# Patient Record
Sex: Female | Born: 1945 | Race: White | Hispanic: No | Smoking: Former smoker
Health system: Southern US, Community
[De-identification: ages and names within clinical notes are randomized; demographics above are authoritative.]

## PROBLEM LIST (undated history)

## (undated) DIAGNOSIS — R51 Headache: Secondary | ICD-10-CM

## (undated) DIAGNOSIS — R519 Headache, unspecified: Secondary | ICD-10-CM

## (undated) HISTORY — PX: DILATION AND CURETTAGE, DIAGNOSTIC / THERAPEUTIC: SUR384

---

## 2018-04-25 ENCOUNTER — Emergency Department (HOSPITAL_COMMUNITY)
Admission: EM | Admit: 2018-04-25 | Discharge: 2018-04-25 | Disposition: A | Discharge status: Home / Self Care | Payer: Medicare Other | Attending: Emergency Medicine | Admitting: Emergency Medicine

## 2018-04-25 ENCOUNTER — Other Ambulatory Visit: Payer: Self-pay

## 2018-04-25 ENCOUNTER — Emergency Department (HOSPITAL_COMMUNITY): Payer: Medicare Other

## 2018-04-25 ENCOUNTER — Encounter (HOSPITAL_COMMUNITY): Payer: Self-pay

## 2018-04-25 DIAGNOSIS — S52531A Colles' fracture of right radius, initial encounter for closed fracture: Secondary | ICD-10-CM | POA: Insufficient documentation

## 2018-04-25 DIAGNOSIS — W19XXXA Unspecified fall, initial encounter: Secondary | ICD-10-CM

## 2018-04-25 DIAGNOSIS — Y999 Unspecified external cause status: Secondary | ICD-10-CM | POA: Insufficient documentation

## 2018-04-25 DIAGNOSIS — Y92008 Other place in unspecified non-institutional (private) residence as the place of occurrence of the external cause: Secondary | ICD-10-CM | POA: Diagnosis not present

## 2018-04-25 DIAGNOSIS — S81811A Laceration without foreign body, right lower leg, initial encounter: Secondary | ICD-10-CM | POA: Diagnosis not present

## 2018-04-25 DIAGNOSIS — W010XXA Fall on same level from slipping, tripping and stumbling without subsequent striking against object, initial encounter: Secondary | ICD-10-CM | POA: Diagnosis not present

## 2018-04-25 DIAGNOSIS — S59911A Unspecified injury of right forearm, initial encounter: Secondary | ICD-10-CM | POA: Diagnosis present

## 2018-04-25 DIAGNOSIS — Y939 Activity, unspecified: Secondary | ICD-10-CM | POA: Diagnosis not present

## 2018-04-25 DIAGNOSIS — Y92009 Unspecified place in unspecified non-institutional (private) residence as the place of occurrence of the external cause: Secondary | ICD-10-CM

## 2018-04-25 LAB — CBG MONITORING, ED: Glucose-Capillary: 98 mg/dL (ref 70–99)

## 2018-04-25 MED ORDER — HYDROCODONE-ACETAMINOPHEN 5-325 MG PO TABS: 0.5000 | ORAL_TABLET | ORAL | 0 refills | Status: DC | PRN

## 2018-04-25 MED ORDER — HYDROCODONE-ACETAMINOPHEN 5-325 MG PO TABS
1.0000 | ORAL_TABLET | Freq: Once | ORAL | Status: AC
  Administered 2018-04-25: 1 via ORAL
  Filled 2018-04-25: qty 1

## 2018-04-25 MED ORDER — TETANUS-DIPHTH-ACELL PERTUSSIS 5-2.5-18.5 LF-MCG/0.5 IM SUSP
0.5000 mL | Freq: Once | INTRAMUSCULAR | Status: AC
  Administered 2018-04-25: 0.5 mL via INTRAMUSCULAR
  Filled 2018-04-25: qty 0.5

## 2018-04-25 NOTE — ED Provider Notes (Addendum)
[facility]-EMERGENCY DEPT Provider Note: Lowella Dell, MD, FACEP  CSN: [id number] MRN: [id number] ARRIVAL: 04/25/18 at 0138 ROOM: WA20/WA20   CHIEF COMPLAINT  Fall   HISTORY OF PRESENT ILLNESS  04/25/18 2:59 AM Susan Fletcher is a 72 y.o. female who fell outside on her brick steps just prior to arrival.  She has right wrist pain with associated deformity.  She is having moderate pain at that site, worse with palpation or movement.  It was splinted prior to arrival.  She also has skin tears to her right shin.  She denies neck pain or back pain.  She is not sure of her tetanus status   History reviewed. No pertinent past medical history.  History reviewed. No pertinent surgical history.  No family history on file.  Social History   Tobacco Use  . Smoking status: Not on file  Substance Use Topics  . Alcohol use: Not on file  . Drug use: Not on file    Prior to Admission medications   Medication Sig Start Date End Date Taking? Authorizing Provider  aspirin-acetaminophen-caffeine (EXCEDRIN MIGRAINE) [id number] MG tablet Take 1 tablet by mouth every 6 (six) hours as needed for headache.   Yes [provider]    Allergies Patient has no known allergies.   REVIEW OF SYSTEMS  Negative except as noted here or in the History of Present Illness.   PHYSICAL EXAMINATION  Initial Vital Signs Blood pressure (!) 128/95, pulse 66, temperature (!) 97.4 F (36.3 C), temperature source Oral, resp. rate 17, height 5\' 1"  (1.549 m), weight 44.5 kg, SpO2 100 %.  Examination General: Well-developed, well-nourished female in no acute distress; appearance consistent with age of record HENT: normocephalic; atraumatic Eyes: pupils equal, round and reactive to light; extraocular muscles intact Neck: supple; nontender Heart: regular rate and rhythm Lungs: clear to auscultation bilaterally Abdomen: soft; nondistended; nontender; bowel sounds present Extremities: Deformity and tenderness  of right wrist, right hand distally neurovascularly intact with intact tendon function in the fingers; pulses normal Neurologic: Awake, alert and oriented; motor function intact in all extremities and symmetric; no facial droop Skin: Warm and dry; 2 skin tears right shin Psychiatric: Normal mood and affect   RESULTS  Summary of this visit's results, reviewed by myself:   EKG Interpretation  Date/Time:    Ventricular Rate:    PR Interval:    QRS Duration:   QT Interval:    QTC Calculation:   R Axis:     Text Interpretation:        Laboratory Studies: Results for orders placed or performed during the hospital encounter of 04/25/18 (from the past 24 hour(s))  POC CBG, ED     Status: None   Collection Time: 04/25/18  1:54 AM  Result Value Ref Range   Glucose-Capillary 98 70 - 99 mg/dL   Imaging Studies: Dg Wrist Complete Right  Result Date: 04/25/2018 CLINICAL DATA:  Fall with wrist pain EXAM: RIGHT WRIST - COMPLETE 3+ VIEW COMPARISON:  None. FINDINGS: There is a comminuted dorsally displaced and angulated fracture of the distal right radius with approximately 18 mm of overriding. No ulna fracture. IMPRESSION: Comminuted, dorsally displaced and angulated fracture of the distal right radius. Electronically Signed   By: Deatra Robinson M.D.   On: 04/25/2018 03:38    ED COURSE and MDM  Nursing notes and initial vitals signs, including pulse oximetry, reviewed.  Vitals:   04/25/18 0149 04/25/18 0151  BP:  (!) 128/95  Pulse:  66  Resp:  17  Temp:  (!) 97.4 F (36.3 C)  TempSrc:  Oral  SpO2:  100%  Weight: 44.5 kg   Height: 5\' 1"  (1.549 m)    3:55 AM Discussed with Dr. Ophelia Charter of orthopedic surgery.  He advises we placed the patient in a sling and he will see her in the office later this morning.   Consultation with the [facility] state controlled substances database reveals the patient has received no opioid prescriptions in the past 2 years.   PROCEDURES    ED  DIAGNOSES     ICD-10-CM   1. Closed Colles' fracture of right radius, initial encounter S52.531A   2. Noninfected skin tear of right leg, initial encounter S81.811A   3. Fall in home, initial encounter W19.XXXA    Y92.009        Paula Libra, MD 04/25/18 0356    Paula Libra, MD 04/25/18 [phone]

## 2018-04-25 NOTE — ED Triage Notes (Signed)
Patient arrives by [facility] with complaints of falling outside on her brick steps. Patient lives by herself. Patient has right wrist injury with deformity. Patient has splint to right wrist area.

## 2018-04-25 NOTE — ED Notes (Signed)
Patient transported to X-ray 

## 2018-04-25 NOTE — ED Notes (Signed)
Ortho tech at bedside 

## 2018-04-25 NOTE — ED Notes (Signed)
Called Ortho Tech ( Mr Annette Stable ) @[phone] 

## 2018-04-26 ENCOUNTER — Ambulatory Visit (INDEPENDENT_AMBULATORY_CARE_PROVIDER_SITE_OTHER): Payer: Medicare Other | Admitting: Surgery

## 2018-04-26 ENCOUNTER — Encounter (INDEPENDENT_AMBULATORY_CARE_PROVIDER_SITE_OTHER): Payer: Self-pay | Admitting: Surgery

## 2018-04-26 ENCOUNTER — Ambulatory Visit (INDEPENDENT_AMBULATORY_CARE_PROVIDER_SITE_OTHER): Payer: Self-pay

## 2018-04-26 VITALS — BP 134/80 | HR 80 | Ht 61.0 in | Wt 98.0 lb

## 2018-04-26 DIAGNOSIS — M25531 Pain in right wrist: Secondary | ICD-10-CM

## 2018-04-27 ENCOUNTER — Encounter (HOSPITAL_BASED_OUTPATIENT_CLINIC_OR_DEPARTMENT_OTHER): Payer: Self-pay | Admitting: *Deleted

## 2018-04-27 ENCOUNTER — Other Ambulatory Visit: Payer: Self-pay

## 2018-04-27 NOTE — Progress Notes (Signed)
Patient states she may not have a ride to the hospital, and may not be able to have someone be with her for 24 hours.  She is going to try to find someone this weekend.

## 2018-04-30 ENCOUNTER — Encounter (HOSPITAL_BASED_OUTPATIENT_CLINIC_OR_DEPARTMENT_OTHER): Payer: Self-pay | Admitting: Anesthesiology

## 2018-04-30 ENCOUNTER — Other Ambulatory Visit: Payer: Self-pay

## 2018-04-30 ENCOUNTER — Ambulatory Visit (HOSPITAL_BASED_OUTPATIENT_CLINIC_OR_DEPARTMENT_OTHER): Payer: Medicare Other | Admitting: Anesthesiology

## 2018-04-30 ENCOUNTER — Encounter (HOSPITAL_BASED_OUTPATIENT_CLINIC_OR_DEPARTMENT_OTHER)
Admission: RE | Disposition: A | Discharge status: Home / Self Care | Payer: Self-pay | Source: Ambulatory Visit | Attending: Orthopaedic Surgery

## 2018-04-30 ENCOUNTER — Ambulatory Visit (HOSPITAL_BASED_OUTPATIENT_CLINIC_OR_DEPARTMENT_OTHER)
Admission: RE | Admit: 2018-04-30 | Discharge: 2018-05-01 | Disposition: A | Discharge status: Home / Self Care | Payer: Medicare Other | Source: Ambulatory Visit | Attending: Orthopaedic Surgery | Admitting: Orthopaedic Surgery

## 2018-04-30 DIAGNOSIS — S52531A Colles' fracture of right radius, initial encounter for closed fracture: Secondary | ICD-10-CM | POA: Diagnosis not present

## 2018-04-30 DIAGNOSIS — S52501A Unspecified fracture of the lower end of right radius, initial encounter for closed fracture: Secondary | ICD-10-CM | POA: Diagnosis present

## 2018-04-30 DIAGNOSIS — W109XXA Fall (on) (from) unspecified stairs and steps, initial encounter: Secondary | ICD-10-CM | POA: Diagnosis not present

## 2018-04-30 DIAGNOSIS — S52551A Other extraarticular fracture of lower end of right radius, initial encounter for closed fracture: Secondary | ICD-10-CM | POA: Diagnosis not present

## 2018-04-30 DIAGNOSIS — Z87891 Personal history of nicotine dependence: Secondary | ICD-10-CM | POA: Diagnosis not present

## 2018-04-30 HISTORY — PX: ORIF WRIST FRACTURE: SHX2133

## 2018-04-30 HISTORY — DX: Headache: R51

## 2018-04-30 HISTORY — DX: Headache, unspecified: R51.9

## 2018-04-30 SURGERY — OPEN REDUCTION INTERNAL FIXATION (ORIF) WRIST FRACTURE
Anesthesia: Regional | Site: Wrist | Laterality: Right

## 2018-04-30 MED ORDER — MIDAZOLAM HCL 2 MG/2ML IJ SOLN
INTRAMUSCULAR | Status: AC
  Filled 2018-04-30: qty 2

## 2018-04-30 MED ORDER — BUPIVACAINE HCL (PF) 0.5 % IJ SOLN
INTRAMUSCULAR | Status: AC
  Filled 2018-04-30: qty 30

## 2018-04-30 MED ORDER — ONDANSETRON HCL 4 MG/2ML IJ SOLN
INTRAMUSCULAR | Status: DC | PRN
  Administered 2018-04-30: 4 mg via INTRAVENOUS

## 2018-04-30 MED ORDER — HYDROCODONE-ACETAMINOPHEN 5-325 MG PO TABS
1.0000 | ORAL_TABLET | ORAL | Status: DC | PRN
  Filled 2018-04-30: qty 1

## 2018-04-30 MED ORDER — LACTATED RINGERS IV SOLN
INTRAVENOUS | Status: DC
  Administered 2018-04-30: 10 mL/h via INTRAVENOUS

## 2018-04-30 MED ORDER — ONDANSETRON HCL 4 MG/2ML IJ SOLN
INTRAMUSCULAR | Status: AC
  Filled 2018-04-30: qty 2

## 2018-04-30 MED ORDER — LIDOCAINE 2% (20 MG/ML) 5 ML SYRINGE
INTRAMUSCULAR | Status: AC
  Filled 2018-04-30: qty 5

## 2018-04-30 MED ORDER — CEFAZOLIN SODIUM-DEXTROSE 2-4 GM/100ML-% IV SOLN
INTRAVENOUS | Status: AC
  Filled 2018-04-30: qty 100

## 2018-04-30 MED ORDER — ONDANSETRON HCL 4 MG PO TABS: 4.0000 mg | ORAL_TABLET | Freq: Four times a day (QID) | ORAL | Status: DC | PRN

## 2018-04-30 MED ORDER — FENTANYL CITRATE (PF) 100 MCG/2ML IJ SOLN
INTRAMUSCULAR | Status: AC
  Filled 2018-04-30: qty 2

## 2018-04-30 MED ORDER — MIDAZOLAM HCL 2 MG/2ML IJ SOLN
1.0000 mg | INTRAMUSCULAR | Status: DC | PRN
  Administered 2018-04-30: 1 mg via INTRAVENOUS

## 2018-04-30 MED ORDER — LIDOCAINE 2% (20 MG/ML) 5 ML SYRINGE
INTRAMUSCULAR | Status: DC | PRN
  Administered 2018-04-30: 50 mg via INTRAVENOUS

## 2018-04-30 MED ORDER — SCOPOLAMINE 1 MG/3DAYS TD PT72: 1.0000 | MEDICATED_PATCH | Freq: Once | TRANSDERMAL | Status: DC | PRN

## 2018-04-30 MED ORDER — PROPOFOL 10 MG/ML IV BOLUS
INTRAVENOUS | Status: AC
  Filled 2018-04-30: qty 20

## 2018-04-30 MED ORDER — ROPIVACAINE HCL 7.5 MG/ML IJ SOLN
INTRAMUSCULAR | Status: DC | PRN
  Administered 2018-04-30: 20 mL via PERINEURAL

## 2018-04-30 MED ORDER — FENTANYL CITRATE (PF) 100 MCG/2ML IJ SOLN
50.0000 ug | INTRAMUSCULAR | Status: DC | PRN
  Administered 2018-04-30 (×2): 50 ug via INTRAVENOUS

## 2018-04-30 MED ORDER — DEXAMETHASONE SODIUM PHOSPHATE 10 MG/ML IJ SOLN
INTRAMUSCULAR | Status: AC
  Filled 2018-04-30: qty 1

## 2018-04-30 MED ORDER — SODIUM CHLORIDE 0.45 % IV SOLN
INTRAVENOUS | Status: DC
  Administered 2018-04-30: 16:00:00 via INTRAVENOUS

## 2018-04-30 MED ORDER — ASPIRIN-ACETAMINOPHEN-CAFFEINE 250-250-65 MG PO TABS: 1.0000 | ORAL_TABLET | Freq: Four times a day (QID) | ORAL | Status: DC | PRN

## 2018-04-30 MED ORDER — EPHEDRINE SULFATE 50 MG/ML IJ SOLN
INTRAMUSCULAR | Status: DC | PRN
  Administered 2018-04-30 (×3): 10 mg via INTRAVENOUS

## 2018-04-30 MED ORDER — MORPHINE SULFATE (PF) 4 MG/ML IV SOLN: 0.5000 mg | INTRAVENOUS | Status: DC | PRN

## 2018-04-30 MED ORDER — ACETAMINOPHEN 500 MG PO TABS
500.0000 mg | ORAL_TABLET | Freq: Four times a day (QID) | ORAL | Status: DC
  Administered 2018-04-30: 500 mg via ORAL
  Filled 2018-04-30: qty 1

## 2018-04-30 MED ORDER — EPHEDRINE 5 MG/ML INJ
INTRAVENOUS | Status: AC
  Filled 2018-04-30: qty 10

## 2018-04-30 MED ORDER — CEFAZOLIN SODIUM-DEXTROSE 2-4 GM/100ML-% IV SOLN
2.0000 g | INTRAVENOUS | Status: AC
  Administered 2018-04-30: 2 g via INTRAVENOUS

## 2018-04-30 MED ORDER — PROPOFOL 10 MG/ML IV BOLUS
INTRAVENOUS | Status: DC | PRN
  Administered 2018-04-30: 100 mg via INTRAVENOUS

## 2018-04-30 MED ORDER — ONDANSETRON HCL 4 MG/2ML IJ SOLN: 4.0000 mg | Freq: Four times a day (QID) | INTRAMUSCULAR | Status: DC | PRN

## 2018-04-30 MED ORDER — CHLORHEXIDINE GLUCONATE 4 % EX LIQD: 60.0000 mL | Freq: Once | CUTANEOUS | Status: DC

## 2018-04-30 MED ORDER — DEXAMETHASONE SODIUM PHOSPHATE 4 MG/ML IJ SOLN
INTRAMUSCULAR | Status: DC | PRN
  Administered 2018-04-30: 10 mg via INTRAVENOUS

## 2018-04-30 MED ORDER — ACETAMINOPHEN 325 MG PO TABS: 325.0000 mg | ORAL_TABLET | Freq: Four times a day (QID) | ORAL | Status: DC | PRN

## 2018-04-30 MED ORDER — HYDROCODONE-ACETAMINOPHEN 5-325 MG PO TABS
1.0000 | ORAL_TABLET | ORAL | Status: DC | PRN
  Administered 2018-05-01: 1 via ORAL

## 2018-04-30 SURGICAL SUPPLY — 56 items
BANDAGE ACE 4X5 VEL STRL LF (GAUZE/BANDAGES/DRESSINGS) ×3 IMPLANT
BIT DRILL 2 FAST STEP (BIT) ×3 IMPLANT
BIT DRILL 2.5X4 QC (BIT) ×3 IMPLANT
BLADE SURG 15 STRL LF DISP TIS (BLADE) ×1 IMPLANT
BLADE SURG 15 STRL SS (BLADE) ×2
BNDG ESMARK 4X9 LF (GAUZE/BANDAGES/DRESSINGS) ×3 IMPLANT
BNDG GAUZE ELAST 4 BULKY (GAUZE/BANDAGES/DRESSINGS) ×3 IMPLANT
CORD BIPOLAR FORCEPS 12FT (ELECTRODE) ×3 IMPLANT
COVER BACK TABLE 60X90IN (DRAPES) ×3 IMPLANT
COVER MAYO STAND STRL (DRAPES) ×3 IMPLANT
COVER WAND RF STERILE (DRAPES) IMPLANT
CUFF TOURNIQUET SINGLE 18IN (TOURNIQUET CUFF) ×3 IMPLANT
DECANTER SPIKE VIAL GLASS SM (MISCELLANEOUS) IMPLANT
DRAPE EXTREMITY T 121X128X90 (DRAPE) ×3 IMPLANT
DRAPE SURG 17X23 STRL (DRAPES) ×3 IMPLANT
DRIVER PEG 2.0 FAST (BIT) ×6 IMPLANT
DURAPREP 26ML APPLICATOR (WOUND CARE) ×3 IMPLANT
GAUZE SPONGE 4X4 12PLY STRL LF (GAUZE/BANDAGES/DRESSINGS) ×3 IMPLANT
GAUZE XEROFORM 1X8 LF (GAUZE/BANDAGES/DRESSINGS) ×3 IMPLANT
GLOVE BIOGEL PI IND STRL 7.0 (GLOVE) ×1 IMPLANT
GLOVE BIOGEL PI IND STRL 8 (GLOVE) ×1 IMPLANT
GLOVE BIOGEL PI INDICATOR 7.0 (GLOVE) ×2
GLOVE BIOGEL PI INDICATOR 8 (GLOVE) ×2
GLOVE ECLIPSE 6.5 STRL STRAW (GLOVE) ×3 IMPLANT
GLOVE ORTHO TXT STRL SZ7.5 (GLOVE) ×3 IMPLANT
GOWN STRL REUS W/ TWL LRG LVL3 (GOWN DISPOSABLE) ×2 IMPLANT
GOWN STRL REUS W/ TWL XL LVL3 (GOWN DISPOSABLE) ×1 IMPLANT
GOWN STRL REUS W/TWL LRG LVL3 (GOWN DISPOSABLE) ×4
GOWN STRL REUS W/TWL XL LVL3 (GOWN DISPOSABLE) ×2
K-WIRE 1.6 (WIRE) ×4
K-WIRE FX5X1.6XNS BN SS (WIRE) ×2
KWIRE FX5X1.6XNS BN SS (WIRE) ×2 IMPLANT
NEEDLE HYPO 25X1 1.5 SAFETY (NEEDLE) ×3 IMPLANT
NS IRRIG 1000ML POUR BTL (IV SOLUTION) ×3 IMPLANT
PACK BASIN DAY SURGERY FS (CUSTOM PROCEDURE TRAY) ×3 IMPLANT
PAD CAST 3X4 CTTN HI CHSV (CAST SUPPLIES) ×1 IMPLANT
PADDING CAST ABS 4INX4YD NS (CAST SUPPLIES) ×2
PADDING CAST ABS COTTON 4X4 ST (CAST SUPPLIES) ×1 IMPLANT
PADDING CAST COTTON 3X4 STRL (CAST SUPPLIES) ×2
PEG SUBCHONDRAL SMOOTH 2.0X18 (Peg) ×9 IMPLANT
PEG SUBCHONDRAL SMOOTH 2.0X20 (Peg) ×9 IMPLANT
PLATE SHORT 24.4X51.3 RT (Plate) ×3 IMPLANT
SCREW BN 12X3.5XNS CORT TI (Screw) ×2 IMPLANT
SCREW CORT 3.5X12 (Screw) ×4 IMPLANT
SCREW MULTI DIRECT 20MM (Screw) ×3 IMPLANT
SPLINT FIBERGLASS 3X35 (CAST SUPPLIES) ×3 IMPLANT
STOCKINETTE 4X48 STRL (DRAPES) ×3 IMPLANT
SUT ETHILON 3 0 PS 1 (SUTURE) ×3 IMPLANT
SUT ETHILON 4 0 PS 2 18 (SUTURE) ×3 IMPLANT
SUT PROLENE 3 0 PS 2 (SUTURE) IMPLANT
SUT VIC AB 3-0 FS2 27 (SUTURE) ×3 IMPLANT
SUT VIC AB 4-0 BRD 54 (SUTURE) IMPLANT
SYR BULB 3OZ (MISCELLANEOUS) ×3 IMPLANT
SYR CONTROL 10ML LL (SYRINGE) IMPLANT
TOWEL GREEN STERILE FF (TOWEL DISPOSABLE) ×6 IMPLANT
UNDERPAD 30X30 (UNDERPADS AND DIAPERS) ×3 IMPLANT

## 2018-04-30 NOTE — Anesthesia Preprocedure Evaluation (Addendum)
Anesthesia Evaluation  Patient identified by MRN, date of birth, ID band Patient awake    Reviewed: Allergy & Precautions, NPO status , Patient's Chart, lab work & pertinent test results  Airway Mallampati: I  TM Distance: >3 FB Neck ROM: Full    Dental no notable dental hx. (+) Dental Advisory Given, Partial Upper   Pulmonary former smoker,    Pulmonary exam normal breath sounds clear to auscultation       Cardiovascular negative cardio ROS Normal cardiovascular exam Rhythm:Regular Rate:Normal     Neuro/Psych  Headaches, negative psych ROS   GI/Hepatic negative GI ROS, Neg liver ROS,   Endo/Other  negative endocrine ROS  Renal/GU negative Renal ROS  negative genitourinary   Musculoskeletal negative musculoskeletal ROS (+)   Abdominal   Peds  Hematology negative hematology ROS (+)   Anesthesia Other Findings Right distal radius fracture  Reproductive/Obstetrics                            Anesthesia Physical Anesthesia Plan  ASA: II  Anesthesia Plan: General and Regional   Post-op Pain Management:  Regional for Post-op pain   Induction: Intravenous  PONV Risk Score and Plan: 3 and Ondansetron, Dexamethasone and Midazolam  Airway Management Planned: LMA  Additional Equipment:   Intra-op Plan:   Post-operative Plan: Extubation in OR  Informed Consent: I have reviewed the patients History and Physical, chart, labs and discussed the procedure including the risks, benefits and alternatives for the proposed anesthesia with the patient or authorized representative who has indicated his/her understanding and acceptance.   Dental advisory given  Plan Discussed with: CRNA  Anesthesia Plan Comments:        Anesthesia Quick Evaluation

## 2018-04-30 NOTE — Progress Notes (Signed)
Spoke with Dr. Ophelia Charter regarding wound on pts right calf with dressing, okay to skip ted hose due to dressing, Also, discussed that patient did not have PCR screen prior to today, will discontinue order for screening and make sure to give ordered ancef.

## 2018-04-30 NOTE — Progress Notes (Signed)
Report given to Rosanne Sack, RN

## 2018-04-30 NOTE — H&P (Signed)
Susan Fletcher is an 72 y.o. female.   Chief Complaint: Angulated displaced right distal radius fracture, closed HPI: 72 year old female fell outside on a brick steps on 04/25/2018 suffering a right distal radius fracture.  Seen in the emergency room on 04/25/2018 by Dr. Read Drivers and placed in a splint.  She had some skin tears on her right shin after the fall.  No loss of consciousness.  No past history of injury to the wrist.  Past Medical History:  Diagnosis Date  . Headache     Past Surgical History:  Procedure Laterality Date  . DILATION AND CURETTAGE, DIAGNOSTIC / THERAPEUTIC      Family History  Problem Relation Age of Onset  . Cancer Mother   . Cancer Father    Social History:  reports that she has quit smoking. She quit after 40.00 years of use. She has never used smokeless tobacco. She reports that she does not use drugs. Her alcohol history is not on file.  Allergies: No Known Allergies  Medications Prior to Admission  Medication Sig Dispense Refill  . aspirin-acetaminophen-caffeine (EXCEDRIN MIGRAINE) 250-250-65 MG tablet Take 1 tablet by mouth every 6 (six) hours as needed for headache.    Marland Kitchen HYDROcodone-acetaminophen (NORCO) 5-325 MG tablet Take 0.5 tablets by mouth every 4 (four) hours as needed (for pain). 12 tablet 0    No results found for this or any previous visit (from the past 48 hour(s)). No results found.  Review of Systems  Constitutional: Negative.   HENT: Negative.   Genitourinary:       Previous D&C  Musculoskeletal:       Positive for fall 04/25/2018 with distal radius fracture, closed  Skin: Negative.   Neurological: Negative.   Endo/Heme/Allergies: Negative.   Psychiatric/Behavioral: Negative.     Blood pressure 137/74, pulse 77, temperature 98.1 F (36.7 C), temperature source Oral, resp. rate 20, height 5' 1.5" (1.562 m), weight 43.8 kg, SpO2 99 %. Physical Exam  Constitutional: She is oriented to person, place, and time. She appears  well-developed and well-nourished.  HENT:  Head: Normocephalic.  Eyes: Pupils are equal, round, and reactive to light.  Neck: Normal range of motion.  Cardiovascular: Normal rate.  Respiratory: Effort normal.  GI: Soft.  Neurological: She is alert and oriented to person, place, and time.     Assessment/Plan Displaced right distal radius fracture for ORIF. Discussed plan with pt . Risks of surgery, ?'s were elicited and answered , she requests we proceed. Has no one at home and plan overnight stay.   [name], MD 04/30/2018, 10:43 AM

## 2018-04-30 NOTE — Interval H&P Note (Signed)
History and Physical Interval Note:  04/30/2018 11:25 AM  Susan Fletcher  has presented today for surgery, with the diagnosis of Right Distal Radius Fracture  The various methods of treatment have been discussed with the patient and family. After consideration of risks, benefits and other options for treatment, the patient has consented to  Procedure(s): OPEN REDUCTION INTERNAL FIXATION (ORIF) RIGHT DISTAL RADIUS (Right) as a surgical intervention .  The patient's history has been reviewed, patient examined, no change in status, stable for surgery.  I have reviewed the patient's chart and labs.  Questions were answered to the patient's satisfaction.     [name]

## 2018-04-30 NOTE — Progress Notes (Signed)
Assisted D. Woodrum with right, ultrasound guided, supraclavicular block. Side rails up, monitors on throughout procedure. See vital signs in flow sheet. Tolerated Procedure well. 

## 2018-04-30 NOTE — Discharge Instructions (Signed)
Keep hand elevated above heart as much as possible to decrease pain and swelling. See Dr. Ophelia Charter in one week in the office    Call your surgeon if you experience:   1.  Fever over 101.0. 2.  Inability to urinate. 3.  Nausea and/or vomiting. 4.  Extreme swelling or bruising at the surgical site. 5.  Continued bleeding from the incision. 6.  Increased pain, redness or drainage from the incision. 7.  Problems related to your pain medication. 8.  Any problems and/or concerns    Post Anesthesia Home Care Instructions  Activity: Get plenty of rest for the remainder of the day. A responsible individual must stay with you for 24 hours following the procedure.  For the next 24 hours, DO NOT: -Drive a car -Advertising copywriter -Drink alcoholic beverages -Take any medication unless instructed by your physician -Make any legal decisions or sign important papers.  Meals: Start with liquid foods such as gelatin or soup. Progress to regular foods as tolerated. Avoid greasy, spicy, heavy foods. If nausea and/or vomiting occur, drink only clear liquids until the nausea and/or vomiting subsides. Call your physician if vomiting continues.  Special Instructions/Symptoms: Your throat may feel dry or sore from the anesthesia or the breathing tube placed in your throat during surgery. If this causes discomfort, gargle with warm salt water. The discomfort should disappear within 24 hours.  If you had a scopolamine patch placed behind your ear for the management of post- operative nausea and/or vomiting:  1. The medication in the patch is effective for 72 hours, after which it should be removed.  Wrap patch in a tissue and discard in the trash. Wash hands thoroughly with soap and water. 2. You may remove the patch earlier than 72 hours if you experience unpleasant side effects which may include dry mouth, dizziness or visual disturbances. 3. Avoid touching the patch. Wash your hands with soap and water  after contact with the patch.

## 2018-04-30 NOTE — Anesthesia Postprocedure Evaluation (Addendum)
Anesthesia Post Note  Patient: Micaela Stith  Procedure(s) Performed: OPEN REDUCTION INTERNAL FIXATION (ORIF) RIGHT DISTAL RADIUS (Right Wrist)     Patient location during evaluation: PACU Anesthesia Type: General Level of consciousness: awake and alert Pain management: pain level controlled Vital Signs Assessment: post-procedure vital signs reviewed and stable Respiratory status: spontaneous breathing and respiratory function stable Cardiovascular status: stable Postop Assessment: no apparent nausea or vomiting Anesthetic complications: no    Last Vitals:  Vitals:   04/30/18 1500 04/30/18 1530  BP: 137/76 136/78  Pulse: 72 75  Resp: (!) 22 20  Temp:  36.6 C  SpO2: 96% 100%    Last Pain:  Vitals:   04/30/18 1530  TempSrc:   PainSc: 0-No pain                 Lachlyn Vanderstelt DANIEL

## 2018-04-30 NOTE — Transfer of Care (Signed)
Immediate Anesthesia Transfer of Care Note  Patient: Susan Fletcher  Procedure(s) Performed: OPEN REDUCTION INTERNAL FIXATION (ORIF) RIGHT DISTAL RADIUS (Right Wrist)  Patient Location: PACU  Anesthesia Type:General and Regional  Level of Consciousness: awake and sedated  Airway & Oxygen Therapy: Patient Spontanous Breathing and Patient connected to face mask oxygen  Post-op Assessment: Report given to RN and Post -op Vital signs reviewed and stable  Post vital signs: Reviewed and stable  Last Vitals:  Vitals Value Taken Time  BP    Temp    Pulse 80 04/30/2018  1:46 PM  Resp 17 04/30/2018  1:46 PM  SpO2 100 % 04/30/2018  1:46 PM  Vitals shown include unvalidated device data.  Last Pain:  Vitals:   04/30/18 1011  TempSrc: Oral  PainSc: 0-No pain         Complications: No apparent anesthesia complications

## 2018-04-30 NOTE — Op Note (Signed)
Preop diagnosis: Right displaced angulated distal radius fracture, closed and extra-articular.  Postop diagnosis: Same  Procedure: ORIF right distal radius, volar plating.  Surgeon: Annell Greening, MD  Anesthesia: LMA general plus preoperative block  Tourniquet: 24 minutes x 250 right upper arm  Implants: Hand innovation standard right volar plate with screws and pegs.  Procedure: After induction general anesthesia with LMA tube placement proximal arm tourniquet was applied standard prepping and draping preoperative Ancef timeout procedure Ancef prophylaxis and extremity sheets and drapes stockinette.  After timeout arm was wrapped in Esmarch tourniquet inflated.  Sterile skin marker had been used incision was made overlying the flexor carpi radialis tendon which was mobilized and taken toward the radial aspect taking the radial artery.  Fracture was identified subperiosteal retraction reduction and held with a K wire through the ulnar styloid bicortical there was good out to length but still slight radial displacement pin was backed up and it was kicked back slightly more ulnar with near anatomic position and pinned with this styloid pin.  Standard plate was selected adjusted pin placed.  It was slightly distal located in the long slotted hole was drilled placing the screw proximally so the plate could be backed up some checked under C arm again and a pin was placed through the distal hole that showed it was extra-articular and plate was in good position.  2 screws were placed proximally both the 12 mm bicortical purple screws 3.5 cortical.  With the fracture reduced distal holes were filled for starting on the ulnar aspect extending to the radial and then following the proximal holes filling all holes.  Multiple variable angle 24 screw was used in the ulnar styloid which and was in good position.  All screws were locked down flush.  Every scroll was filled for the distal small pegs and all were smooth  pegs except for the one screw that was variable angle partially-threaded that was in the ulnar styloid.  Tourniquet was deflated irrigation hemostasis was obtained bipolar cautery been used on the way and there is minimal bleeding.  One superficial vein was coagulated.  Repeat irrigation reapproximation subtenons tissue with 2-0 Vicryl.  4-0 and 3-0 nylon skin closure interrupted sutures.  Xeroform 4 x 4's 20 nurse ABD web roll and dorsal fiberglass short arm splint application with Ace wrap.  Patient will stay overnight since she has no one at home.  Patient tolerated procedure well transferred to recovery room in stable condition.

## 2018-04-30 NOTE — Anesthesia Procedure Notes (Signed)
Anesthesia Regional Block: Supraclavicular block   Pre-Anesthetic Checklist: ,, timeout performed, Correct Patient, Correct Site, Correct Laterality, Correct Procedure, Correct Position, site marked, Risks and benefits discussed,  Surgical consent,  Pre-op evaluation,  At surgeon's request and post-op pain management  Laterality: Right  Prep: Maximum Sterile Barrier Precautions used, chloraprep       Needles:  Injection technique: Single-shot  Needle Type: Echogenic Stimulator Needle     Needle Length: 4cm  Needle Gauge: 22     Additional Needles:   Procedures:,,,, ultrasound used (permanent image in chart),,,,  Narrative:  Start time: 04/30/2018 11:07 AM End time: 04/30/2018 11:17 AM Injection made incrementally with aspirations every 5 mL.  Performed by: Personally  Anesthesiologist: Elmer Picker, MD  Additional Notes: Monitors applied. No increased pain on injection. No increased resistance to injection. Injection made in 5cc increments. Good needle visualization. Patient tolerated procedure well.

## 2018-04-30 NOTE — Anesthesia Procedure Notes (Signed)
Procedure Name: LMA Insertion Performed by: York Grice, CRNA Pre-anesthesia Checklist: Patient identified, Emergency Drugs available, Suction available and Patient being monitored Patient Re-evaluated:Patient Re-evaluated prior to induction Oxygen Delivery Method: Circle system utilized Preoxygenation: Pre-oxygenation with 100% oxygen Induction Type: IV induction Ventilation: Mask ventilation without difficulty LMA: LMA inserted LMA Size: 3.0 Number of attempts: 1 Airway Equipment and Method: Bite block Placement Confirmation: positive ETCO2 Tube secured with: Tape Dental Injury: Teeth and Oropharynx as per pre-operative assessment

## 2018-05-01 ENCOUNTER — Encounter (HOSPITAL_BASED_OUTPATIENT_CLINIC_OR_DEPARTMENT_OTHER): Payer: Self-pay | Admitting: Orthopaedic Surgery

## 2018-05-01 DIAGNOSIS — S52551A Other extraarticular fracture of lower end of right radius, initial encounter for closed fracture: Secondary | ICD-10-CM | POA: Diagnosis not present

## 2018-05-01 NOTE — Anesthesia Postprocedure Evaluation (Signed)
Anesthesia Post Note  Patient: Peytan Andringa  Procedure(s) Performed: OPEN REDUCTION INTERNAL FIXATION (ORIF) RIGHT DISTAL RADIUS (Right Wrist)     Patient location during evaluation: PACU Anesthesia Type: Regional and General Level of consciousness: awake and alert Pain management: pain level controlled Vital Signs Assessment: post-procedure vital signs reviewed and stable Respiratory status: spontaneous breathing, nonlabored ventilation, respiratory function stable and patient connected to nasal cannula oxygen Cardiovascular status: blood pressure returned to baseline and stable Postop Assessment: no apparent nausea or vomiting Anesthetic complications: no    Last Vitals:  Vitals:   04/30/18 2330 05/01/18 0130  BP:  116/70  Pulse:  72  Resp:  18  Temp:  36.7 C  SpO2: 98% 99%    Last Pain:  Vitals:   05/01/18 0415  TempSrc:   PainSc: 0-No pain                 Chelsey L Woodrum

## 2018-05-08 ENCOUNTER — Ambulatory Visit (INDEPENDENT_AMBULATORY_CARE_PROVIDER_SITE_OTHER): Payer: Medicare Other | Admitting: Orthopaedic Surgery

## 2018-05-08 ENCOUNTER — Encounter (INDEPENDENT_AMBULATORY_CARE_PROVIDER_SITE_OTHER): Payer: Self-pay | Admitting: Orthopaedic Surgery

## 2018-05-08 ENCOUNTER — Ambulatory Visit (INDEPENDENT_AMBULATORY_CARE_PROVIDER_SITE_OTHER): Payer: Self-pay

## 2018-05-08 VITALS — BP 127/75 | HR 79 | Ht 60.0 in | Wt 98.0 lb

## 2018-05-08 DIAGNOSIS — M25531 Pain in right wrist: Secondary | ICD-10-CM

## 2018-05-08 NOTE — Progress Notes (Signed)
   Post-Op Visit Note   Patient: Susan Fletcher           Date of Birth: [birth year]           MRN: [id number] Visit Date: 05/08/2018 PCP: Patient, No Pcp Per   Assessment & Plan: Postop ORIF right distal radius.  Incision looks good she has some numbness in her little finger.  Mild finger swelling she is working on flexion extension of her fingers.  Wrist splint stockinette applied return 1 week for likely suture removal.  Chief Complaint:  Chief Complaint  Patient presents with  . Right Wrist - Routine Post Op    04/30/18 ORIF Right Distal Radius   Visit Diagnoses:  1. Right wrist pain     Plan: As above return 1 week for likely suture removal.  Follow-Up Instructions: No follow-ups on file.   Orders:  Orders Placed This Encounter  Procedures  . XR Wrist Complete Right   No orders of the defined types were placed in this encounter.   Imaging: Xr Wrist Complete Right  Result Date: 05/08/2018 AP lateral oblique x-rays right distal radius obtained and reviewed.  This shows volar plating with satisfactory reduction alignment.  All pins are in good position. Impression: Satisfactory ORIF right distal radius fracture with volar plate.   PMFS History: Patient Active Problem List   Diagnosis Date Noted  . Distal radius fracture, right 04/30/2018   Past Medical History:  Diagnosis Date  . Headache     Family History  Problem Relation Age of Onset  . Cancer Mother   . Cancer Father     Past Surgical History:  Procedure Laterality Date  . DILATION AND CURETTAGE, DIAGNOSTIC / THERAPEUTIC    . ORIF WRIST FRACTURE Right 04/30/2018   Procedure: OPEN REDUCTION INTERNAL FIXATION (ORIF) RIGHT DISTAL RADIUS;  Surgeon: [name], MD;  Location: [facility];  Service: Orthopedics;  Laterality: Right;   Social History   Occupational History  . Occupation: retired  Tobacco Use  . Smoking status: Former Smoker    Years: 40.00  . Smokeless tobacco: Never  Used  Substance and Sexual Activity  . Alcohol use: Not on file    Comment: occasionally  . Drug use: Never  . Sexual activity: Not on file

## 2018-05-15 ENCOUNTER — Ambulatory Visit (INDEPENDENT_AMBULATORY_CARE_PROVIDER_SITE_OTHER): Payer: Medicare Other | Admitting: Orthopaedic Surgery

## 2018-05-15 ENCOUNTER — Encounter (INDEPENDENT_AMBULATORY_CARE_PROVIDER_SITE_OTHER): Payer: Self-pay | Admitting: Orthopaedic Surgery

## 2018-05-15 VITALS — BP 135/77 | HR 78 | Ht 60.0 in | Wt 98.0 lb

## 2018-05-15 DIAGNOSIS — S52531D Colles' fracture of right radius, subsequent encounter for closed fracture with routine healing: Secondary | ICD-10-CM

## 2018-05-15 NOTE — Progress Notes (Signed)
   Post-Op Visit Note   Patient: Susan Fletcher           Date of Birth: 1946/04/09           MRN: [id number] Visit Date: 05/15/2018 PCP: Patient, No Pcp Per   Assessment & Plan: Postop ORIF distal radius fracture, right.  Sutures removed incision looks good she is working on range of motion on her own.  Pronation supination and wrist flexion extension.  Recheck 1 month.  Repeat x-rays on return.  Chief Complaint:  Chief Complaint  Patient presents with  . Right Wrist - Follow-up    04/30/18 ORIF Right Distal Radius   Visit Diagnoses:  1. Closed Colles' fracture of right radius with routine healing, subsequent encounter     Plan: Continue working on range of motion out of her splint.  Return visit 1 month repeat x-rays on return.  Follow-Up Instructions: No follow-ups on file.   Orders:  No orders of the defined types were placed in this encounter.  No orders of the defined types were placed in this encounter.   Imaging: No results found.  PMFS History: Patient Active Problem List   Diagnosis Date Noted  . Distal radius fracture, right 04/30/2018   Past Medical History:  Diagnosis Date  . Headache     Family History  Problem Relation Age of Onset  . Cancer Mother   . Cancer Father     Past Surgical History:  Procedure Laterality Date  . DILATION AND CURETTAGE, DIAGNOSTIC / THERAPEUTIC    . ORIF WRIST FRACTURE Right 04/30/2018   Procedure: OPEN REDUCTION INTERNAL FIXATION (ORIF) RIGHT DISTAL RADIUS;  Surgeon: [name], MD;  Location: [facility];  Service: Orthopedics;  Laterality: Right;   Social History   Occupational History  . Occupation: retired  Tobacco Use  . Smoking status: Former Smoker    Years: 40.00  . Smokeless tobacco: Never Used  Substance and Sexual Activity  . Alcohol use: Not on file    Comment: occasionally  . Drug use: Never  . Sexual activity: Not on file

## 2018-05-29 NOTE — Progress Notes (Signed)
Office Visit Note   Patient: Susan Fletcher           Date of Birth: 1945/07/28           MRN: [id number] Visit Date: 04/26/2018              Requested by: No referring provider defined for this encounter. PCP: Patient, No Pcp Per   Assessment & Plan: Visit Diagnoses:  1. Right wrist pain     Plan: Advised patient that she will be needing ORIF right distal radius.  Dr. Ophelia Charter did review x-rays outside of the office today.  Surgery procedure discussed with patient.  Preop paperwork filled out.  Patient was placed in a more comfortable splint.  Plan for surgery next week.  Follow-Up Instructions: Return for need return office visit 1 week postop.   Orders:  Orders Placed This Encounter  Procedures  . XR Wrist 2 Views Right   No orders of the defined types were placed in this encounter.     Procedures: No procedures performed   Clinical Data: No additional findings.   Subjective: Chief Complaint  Patient presents with  . Right Wrist - Fracture    HPI 72 year old female comes in today for evaluation of right distal radius fracture.  She was seen in the ER yesterday after suffering a fall landing onto an outstretched right hand.  Her x-rays showed comminuted dorsally displaced and angulated fracture of the distal right radius with approximately 18 mm of overriding.  No ulnar fracture.  Patient was put into a splint.  States that she has been having increased swelling in her hand along with some numbness. Review of Systems No current cardiopulmonary GI GU issues  Objective: Vital Signs: BP 134/80   Pulse 80   Ht 5\' 1"  (1.549 m)   Wt 98 lb (44.5 kg)   BMI 18.52 kg/m   Physical Exam  Constitutional: She is oriented to person, place, and time. No distress.  HENT:  Head: Normocephalic and atraumatic.  Eyes: Pupils are equal, round, and reactive to light. EOM are normal.  Pulmonary/Chest: No respiratory distress.  Musculoskeletal:  Splint on right upper extremity.   Patient does have swelling and bruising of fingers on the right hand.  Some slight decreased sensation of the fingers.  Moves fingers well.  Neurological: She is alert and oriented to person, place, and time.  Skin: Skin is warm and dry.    Ortho Exam  Specialty Comments:  No specialty comments available.  Imaging: No results found.   PMFS History: Patient Active Problem List   Diagnosis Date Noted  . Distal radius fracture, right 04/30/2018   Past Medical History:  Diagnosis Date  . Headache     Family History  Problem Relation Age of Onset  . Cancer Mother   . Cancer Father     Past Surgical History:  Procedure Laterality Date  . DILATION AND CURETTAGE, DIAGNOSTIC / THERAPEUTIC    . ORIF WRIST FRACTURE Right 04/30/2018   Procedure: OPEN REDUCTION INTERNAL FIXATION (ORIF) RIGHT DISTAL RADIUS;  Surgeon: [name], MD;  Location: [facility];  Service: Orthopedics;  Laterality: Right;   Social History   Occupational History  . Occupation: retired  Tobacco Use  . Smoking status: Former Smoker    Years: 40.00  . Smokeless tobacco: Never Used  Substance and Sexual Activity  . Alcohol use: Not on file    Comment: occasionally  . Drug use: Never  . Sexual  activity: Not on file

## 2018-06-12 ENCOUNTER — Ambulatory Visit (INDEPENDENT_AMBULATORY_CARE_PROVIDER_SITE_OTHER): Payer: Medicare Other | Admitting: Orthopaedic Surgery

## 2018-06-12 ENCOUNTER — Encounter (INDEPENDENT_AMBULATORY_CARE_PROVIDER_SITE_OTHER): Payer: Self-pay | Admitting: Orthopaedic Surgery

## 2018-06-12 ENCOUNTER — Ambulatory Visit (INDEPENDENT_AMBULATORY_CARE_PROVIDER_SITE_OTHER): Payer: Self-pay

## 2018-06-12 VITALS — BP 112/72 | HR 85 | Ht 61.5 in | Wt 98.0 lb

## 2018-06-12 DIAGNOSIS — S52531D Colles' fracture of right radius, subsequent encounter for closed fracture with routine healing: Secondary | ICD-10-CM | POA: Diagnosis not present

## 2018-06-12 NOTE — Progress Notes (Signed)
   Post-Op Visit Note   Patient: Susan CobbsMaria Berhe           Date of Birth: 06/07/1946           MRN: [id number] Visit Date: 06/12/2018 PCP: Patient, No Pcp Per   Assessment & Plan: Patient started on housework raking leaves states her wrist feels good.  She is back to all activities of daily living.  She has 70% flexion extension contralateral wrist and x-rays show satisfactory healing.  She can return as needed.  Chief Complaint:  Chief Complaint  Patient presents with  . Right Wrist - Follow-up    04/30/18 ORIF Right Distal Radius   Visit Diagnoses:  1. Closed Colles' fracture of right radius with routine healing, subsequent encounter     Plan: Patient is released from care she can continue doing normal activities follow-up as needed.  Follow-Up Instructions: Return if symptoms worsen or fail to improve.   Orders:  Orders Placed This Encounter  Procedures  . XR Wrist 2 Views Right   No orders of the defined types were placed in this encounter.   Imaging: Xr Wrist 2 Views Right  Result Date: 06/12/2018 AP lateral right wrist obtained and reviewed.  This shows interval healing of the distal radius comminuted fracture. Impression: Satisfactorily healed right distal radius fracture.   PMFS History: Patient Active Problem List   Diagnosis Date Noted  . Distal radius fracture, right 04/30/2018   Past Medical History:  Diagnosis Date  . Headache     Family History  Problem Relation Age of Onset  . Cancer Mother   . Cancer Father     Past Surgical History:  Procedure Laterality Date  . DILATION AND CURETTAGE, DIAGNOSTIC / THERAPEUTIC    . ORIF WRIST FRACTURE Right 04/30/2018   Procedure: OPEN REDUCTION INTERNAL FIXATION (ORIF) RIGHT DISTAL RADIUS;  Surgeon: Eldred MangesYates, Adali Pennings C, MD;  Location: [facility];  Service: Orthopedics;  Laterality: Right;   Social History   Occupational History  . Occupation: retired  Tobacco Use  . Smoking status: Former Smoker     Years: 40.00  . Smokeless tobacco: Never Used  Substance and Sexual Activity  . Alcohol use: Not on file    Comment: occasionally  . Drug use: Never  . Sexual activity: Not on file

## 2019-04-13 IMAGING — CR DG WRIST COMPLETE 3+V*R*
4 series · 4 of 4 positions shown · non-contrast
Comparison: None.

CLINICAL DATA: Fall with wrist pain

EXAM:
RIGHT WRIST - COMPLETE 3+ VIEW

[x wrist pa right]
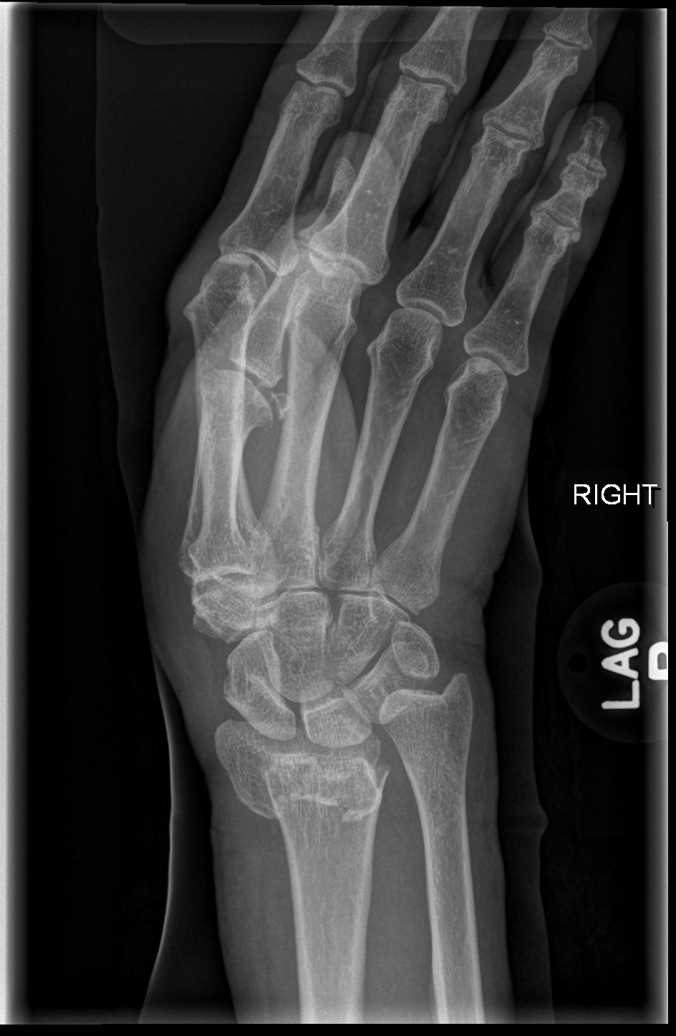

[x wrist obl right]
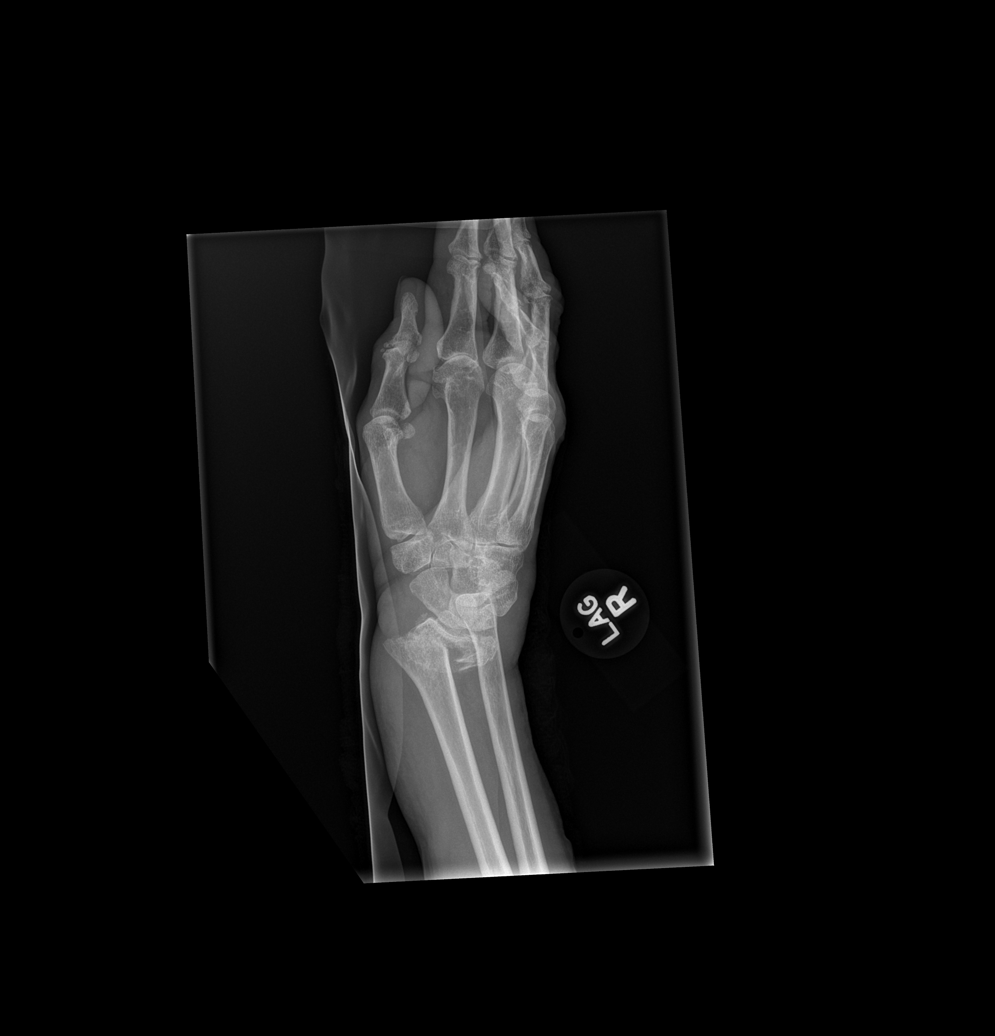

[x wrist lat right (1 of 2)]
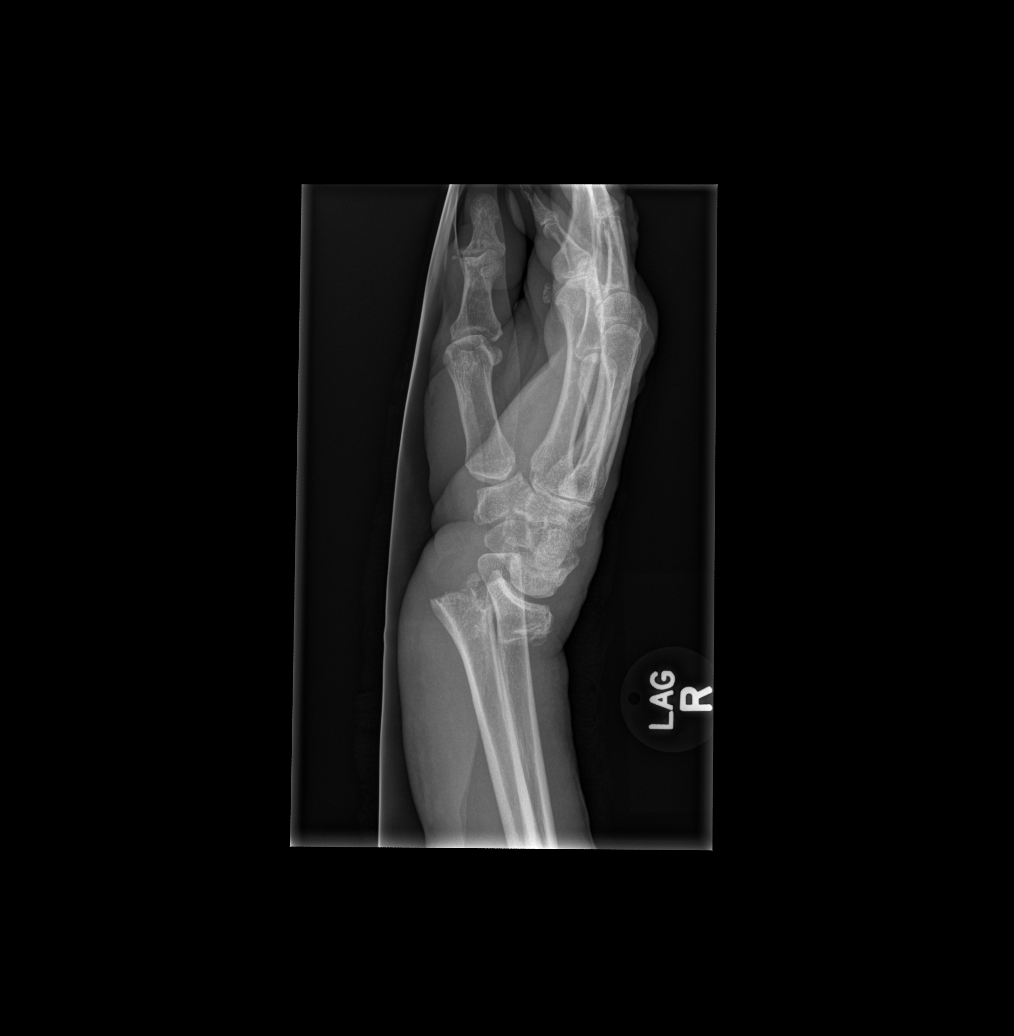

[x wrist lat right (2 of 2)]
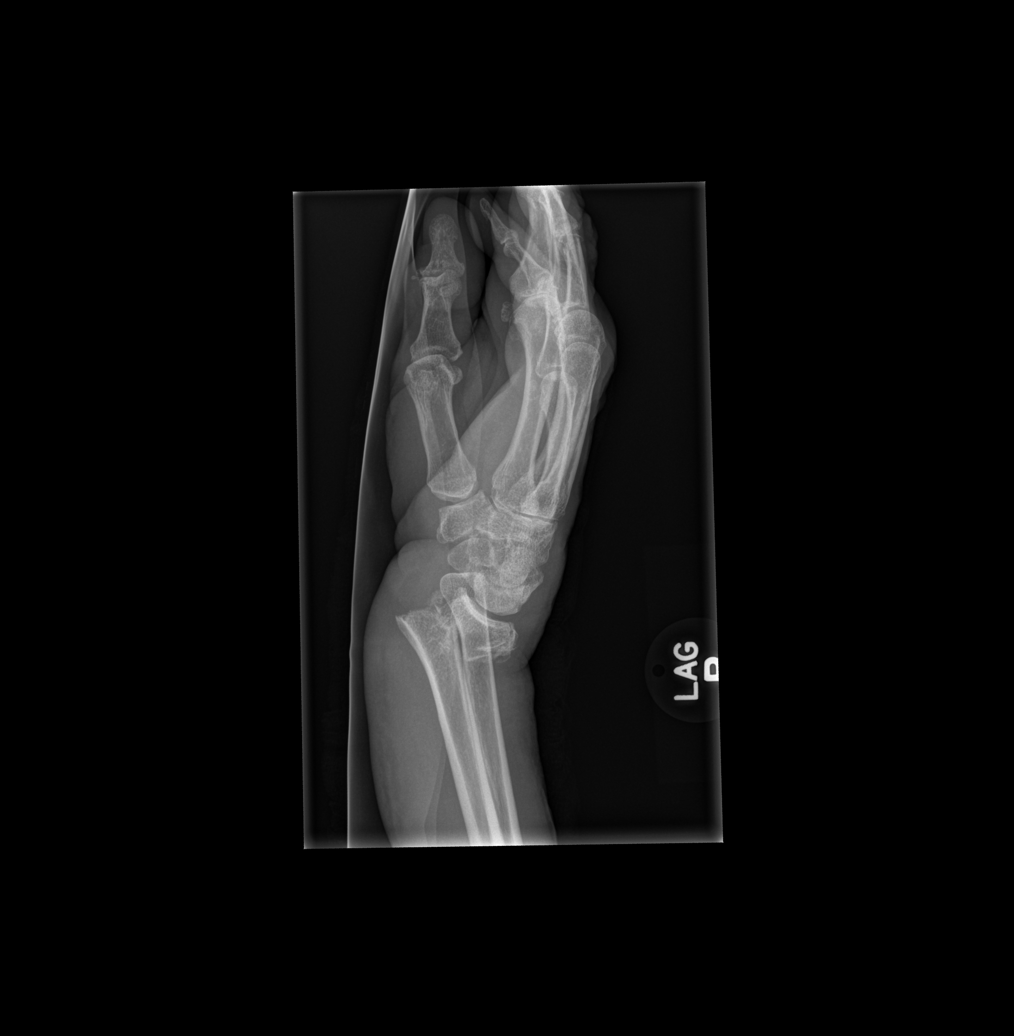

[4 of 4 positions shown; findings below may reference images not displayed]

FINDINGS: There is a comminuted dorsally displaced and angulated fracture of
the distal right radius with approximately 18 mm of overriding. No
ulna fracture.
IMPRESSION: Comminuted, dorsally displaced and angulated fracture of the distal
right radius.

## 2019-06-04 ENCOUNTER — Ambulatory Visit: Payer: Medicare Other | Admitting: Orthopaedic Surgery

## 2019-06-04 ENCOUNTER — Ambulatory Visit: Payer: Self-pay

## 2019-06-04 ENCOUNTER — Encounter: Payer: Self-pay | Admitting: Orthopaedic Surgery

## 2019-06-04 VITALS — Ht 61.0 in | Wt 97.0 lb

## 2019-06-04 DIAGNOSIS — M25531 Pain in right wrist: Secondary | ICD-10-CM | POA: Diagnosis not present

## 2019-06-04 NOTE — Progress Notes (Signed)
Office Visit Note   Patient: Susan Fletcher           Date of Birth: 1945-10-02           MRN: [id number] Visit Date: 06/04/2019              Requested by: No referring provider defined for this encounter. PCP: Patient, No Pcp Per   Assessment & Plan: Visit Diagnoses:  1. Pain in right wrist     Plan: Patient probably had a ganglion over the first dorsal compartment which has resolved.  She has good range of motion no tenderness.  If she gets recurrent she can return.  No evidence of stenosing tenosynovitis on today's exam.  All dorsal compartment are normal to exam.  Follow-Up Instructions: No follow-ups on file.   Orders:  Orders Placed This Encounter  Procedures  . XR Wrist Complete Right   No orders of the defined types were placed in this encounter.     Procedures: No procedures performed   Clinical Data: No additional findings.   Subjective: Chief Complaint  Patient presents with  . Right Wrist - Pain    04/30/18 ORIF Right Distal Radius    HPI 73 year old female returns she underwent open reduction internal fixation of right distal radius fracture October 2019 and recently noted prominence or mass over the first dorsal compartment of the right wrist which gradually enlarged and 2 days ago she felt a pop and it resolved.  It was not painful she states it was ballotable.  No history of trauma.  She has been using her wrist well post ORIF with volar plating.  Review of Systems 14 point systems update unchanged from 2019 surgery on her wrist other than as mentioned in HPI.  Objective: Vital Signs: Ht 5\' 1"  (1.549 m)   Wt 97 lb (44 kg)   BMI 18.33 kg/m   Physical Exam Constitutional:      Appearance: She is well-developed.  HENT:     Head: Normocephalic.     Right Ear: External ear normal.     Left Ear: External ear normal.  Eyes:     Pupils: Pupils are equal, round, and reactive to light.  Neck:     Thyroid: No thyromegaly.     Trachea: No  tracheal deviation.  Cardiovascular:     Rate and Rhythm: Normal rate.  Pulmonary:     Effort: Pulmonary effort is normal.  Abdominal:     Palpations: Abdomen is soft.  Skin:    General: Skin is warm and dry.  Neurological:     Mental Status: She is alert and oriented to person, place, and time.  Psychiatric:        Behavior: Behavior normal.     Ortho Exam first dorsal compartment is nontender negative Finkelstein test.  No palpable mass she points directly over the first dorsal compartment where she noted the swelling.  Volar incisions well-healed good flexion extension no crepitus.  Section fingertips is intact carpal tunnel exam is negative.  Specialty Comments:  No specialty comments available.  Imaging: No results found.   PMFS History: Patient Active Problem List   Diagnosis Date Noted  . Distal radius fracture, right 04/30/2018   Past Medical History:  Diagnosis Date  . Headache     Family History  Problem Relation Age of Onset  . Cancer Mother   . Cancer Father     Past Surgical History:  Procedure Laterality Date  . DILATION AND CURETTAGE,  DIAGNOSTIC / THERAPEUTIC    . ORIF WRIST FRACTURE Right 04/30/2018   Procedure: OPEN REDUCTION INTERNAL FIXATION (ORIF) RIGHT DISTAL RADIUS;  Surgeon: Marybelle Killings, MD;  Location: [facility];  Service: Orthopedics;  Laterality: Right;   Social History   Occupational History  . Occupation: retired  Tobacco Use  . Smoking status: Former Smoker    Years: 40.00  . Smokeless tobacco: Never Used  Substance and Sexual Activity  . Alcohol use: Not on file    Comment: occasionally  . Drug use: Never  . Sexual activity: Not on file

## 2021-11-04 ENCOUNTER — Ambulatory Visit (INDEPENDENT_AMBULATORY_CARE_PROVIDER_SITE_OTHER): Payer: Medicare PPO | Admitting: Nurse Practitioner

## 2021-11-04 ENCOUNTER — Encounter: Payer: Self-pay | Admitting: Nurse Practitioner

## 2021-11-04 VITALS — BP 110/68 | HR 83 | Temp 98.5°F | Ht 61.0 in | Wt 92.6 lb

## 2021-11-04 DIAGNOSIS — Z01812 Encounter for preprocedural laboratory examination: Secondary | ICD-10-CM

## 2021-11-04 DIAGNOSIS — Z0000 Encounter for general adult medical examination without abnormal findings: Secondary | ICD-10-CM

## 2021-11-04 DIAGNOSIS — Z01810 Encounter for preprocedural cardiovascular examination: Secondary | ICD-10-CM

## 2021-11-04 NOTE — Progress Notes (Signed)
?Industrial/product designer as a Education administrator for [employer], FNP.,have documented all relevant documentation on the behalf of Minette Brine, FNP,as directed by  Minette Brine, FNP while in the presence of Minette Brine, [place]. ? ?This visit occurred during the SARS-CoV-2 public health emergency.  Safety protocols were in place, including screening questions prior to the visit, additional usage of staff PPE, and extensive cleaning of exam room while observing appropriate contact time as indicated for disinfecting solutions. ? ?Subjective:  ?  ? Patient ID: Susan Fletcher , female    DOB: 1946/05/18 , 76 y.o.   MRN: [id number] ? ? ?Chief Complaint  ?Patient presents with  ? Establish Care  ? ? ?HPI ? ?Patient presents to establish care. She is originally from [place] and has been in the [place] since 1968 She is retired Forensic psychologist at [employer]. Divorced. No children.   ? ?She had a broken right wrist 4 years ago and had surgery.  She is gardening and lifting as she did before.  ? ?[facility] - mother who is 66 y/o no health problems.    ? ? ?  ? ?Past Medical History:  ?Diagnosis Date  ? Headache   ?  ? ?Family History  ?Problem Relation Age of Onset  ? Healthy Mother   ? ? ? ?Current Outpatient Medications:  ?  aspirin-acetaminophen-caffeine (EXCEDRIN MIGRAINE) 250-250-65 MG tablet, Take 1 tablet by mouth every 6 (six) hours as needed for headache., Disp: , Rfl:   ? ?No Known Allergies  ? ? ?The patient states she is post menopausal status.   No LMP recorded. Patient is postmenopausal.. Negative for Dysmenorrhea and Negative for Menorrhagia. Negative for: breast discharge, breast lump(s), breast pain and breast self exam. Associated symptoms include abnormal vaginal bleeding. Pertinent negatives include abnormal bleeding (hematology), anxiety, decreased libido, depression, difficulty falling sleep, dyspareunia, history of infertility, nocturia, sexual dysfunction, sleep disturbances, urinary incontinence, urinary urgency, vaginal  discharge and vaginal itching. Diet regular.The patient states her exercise level is moderate - walking ? ?The patient's tobacco use is:  ?Social History  ? ?Tobacco Use  ?Smoking Status Former  ? Years: 40.00  ? Types: Cigarettes  ?Smokeless Tobacco Never  ? ?She has been exposed to passive smoke. The patient's alcohol use is:  ?Social History  ? ?Substance and Sexual Activity  ?Alcohol Use Yes  ? Comment: occasionally  ? ? ?Review of Systems  ?Constitutional: Negative.   ?HENT: Negative.    ?Eyes: Negative.   ?Respiratory: Negative.    ?Cardiovascular: Negative.   ?Gastrointestinal: Negative.   ?Endocrine: Negative.   ?Genitourinary: Negative.   ?Musculoskeletal: Negative.   ?Skin: Negative.   ?Allergic/Immunologic: Negative.   ?Neurological: Negative.   ?Hematological: Negative.   ?Psychiatric/Behavioral: Negative.     ? ?Today's Vitals  ? 11/04/21 1457  ?BP: 110/68  ?Pulse: 83  ?Temp: 98.5 ?F (36.9 ?C)  ?TempSrc: Oral  ?Weight: 92 lb 9.6 oz (42 kg)  ?Height: 5' 1" (1.549 m)  ? ?Body mass index is 17.5 kg/m?.  ? ?Objective:  ?Physical Exam ?Constitutional:   ?   General: She is not in acute distress. ?   Appearance: Normal appearance. She is well-developed.  ?HENT:  ?   Head: Normocephalic and atraumatic.  ?   Right Ear: Hearing, tympanic membrane, ear canal and external ear normal. There is no impacted cerumen.  ?   Left Ear: Hearing, tympanic membrane, ear canal and external ear normal. There is no impacted cerumen.  ?   Nose:  ?  Comments: Deferred - masked ?   Mouth/Throat:  ?   Comments: Deferred - masked ?Eyes:  ?   General: Lids are normal.  ?   Extraocular Movements: Extraocular movements intact.  ?   Conjunctiva/sclera: Conjunctivae normal.  ?   Pupils: Pupils are equal, round, and reactive to light.  ?   Funduscopic exam: ?   Right eye: No papilledema.     ?   Left eye: No papilledema.  ?Neck:  ?   Thyroid: No thyroid mass.  ?   Vascular: No carotid bruit.  ?Cardiovascular:  ?   Rate and Rhythm: Normal  rate and regular rhythm.  ?   Pulses: Normal pulses.  ?   Heart sounds: Normal heart sounds. No murmur heard. ?Pulmonary:  ?   Effort: Pulmonary effort is normal.  ?   Breath sounds: Normal breath sounds.  ?Chest:  ?   Chest wall: No mass.  ?Breasts: ?   Tanner Score is 5.  ?   Right: Normal. No mass or tenderness.  ?   Left: Normal. No mass or tenderness.  ?Abdominal:  ?   General: Abdomen is flat. Bowel sounds are normal. There is no distension.  ?   Palpations: Abdomen is soft.  ?   Tenderness: There is no abdominal tenderness.  ?Genitourinary: ?   Comments: Deferred ?Musculoskeletal:     ?   General: No swelling. Normal range of motion.  ?   Cervical back: Full passive range of motion without pain, normal range of motion and neck supple.  ?   Right lower leg: No edema.  ?   Left lower leg: No edema.  ?Lymphadenopathy:  ?   Upper Body:  ?   Right upper body: No supraclavicular, axillary or pectoral adenopathy.  ?   Left upper body: No supraclavicular, axillary or pectoral adenopathy.  ?Skin: ?   General: Skin is warm and dry.  ?   Capillary Refill: Capillary refill takes less than 2 seconds.  ?Neurological:  ?   General: No focal deficit present.  ?   Mental Status: She is alert and oriented to person, place, and time.  ?   Cranial Nerves: No cranial nerve deficit.  ?   Sensory: No sensory deficit.  ?Psychiatric:     ?   Mood and Affect: Mood normal.     ?   Behavior: Behavior normal.     ?   Thought Content: Thought content normal.     ?   Judgment: Judgment normal.  ?  ? ?   ?Assessment And Plan:  ?   ?1. Health maintenance examination ?Behavior modifications discussed and diet history reviewed.   ?Pt will continue to exercise regularly and modify diet with low GI, plant based foods and decrease intake of processed foods.  ?Recommend intake of daily multivitamin, Vitamin D, and calcium.  ?Recommend for preventive screenings, as well as recommend immunizations that include  TDAP ? ?2. Pre-operative  cardiovascular examination ?Comments: Due to her age and wanting to have surgery will refer to cardiology for clearance.  ?- EKG 12-Lead ?- Ambulatory referral to Cardiology ? ?3. Pre-operative laboratory examination ?- CBC with Diff ?- CMP14+EGFR ?- Hemoglobin A1c ?- Protime-INR ?- APTT ? ? ? ? ?Patient was given opportunity to ask questions. Patient verbalized understanding of the plan and was able to repeat key elements of the plan. All questions were answered to their satisfaction.  ? ?Minette Brine, FNP  ? ?I, Minette Brine, FNP, have  reviewed all documentation for this visit. The documentation on 11/04/21 for the exam, diagnosis, procedures, and orders are all accurate and complete.  ? ?THE PATIENT IS ENCOURAGED TO PRACTICE SOCIAL DISTANCING DUE TO THE COVID-19 PANDEMIC.   ?

## 2021-11-05 LAB — CBC WITH DIFFERENTIAL/PLATELET
Basophils Absolute: 0.1 10*3/uL (ref 0.0–0.2)
Basos: 1 %
EOS (ABSOLUTE): 0.1 10*3/uL (ref 0.0–0.4)
Eos: 1 %
Hematocrit: 41.5 % (ref 34.0–46.6)
Hemoglobin: 13.8 g/dL (ref 11.1–15.9)
Immature Grans (Abs): 0 10*3/uL (ref 0.0–0.1)
Immature Granulocytes: 0 %
Lymphocytes Absolute: 2 10*3/uL (ref 0.7–3.1)
Lymphs: 23 %
MCH: 31.7 pg (ref 26.6–33.0)
MCHC: 33.3 g/dL (ref 31.5–35.7)
MCV: 95 fL (ref 79–97)
Monocytes Absolute: 0.6 10*3/uL (ref 0.1–0.9)
Monocytes: 7 %
Neutrophils Absolute: 6 10*3/uL (ref 1.4–7.0)
Neutrophils: 68 %
Platelets: 360 10*3/uL (ref 150–450)
RBC: 4.36 x10E6/uL (ref 3.77–5.28)
RDW: 12.1 % (ref 11.7–15.4)
WBC: 8.8 10*3/uL (ref 3.4–10.8)

## 2021-11-05 LAB — CMP14+EGFR
ALT: 18 IU/L (ref 0–32)
AST: 24 IU/L (ref 0–40)
Albumin/Globulin Ratio: 1.7 (ref 1.2–2.2)
Albumin: 4.4 g/dL (ref 3.7–4.7)
Alkaline Phosphatase: 71 IU/L (ref 44–121)
BUN/Creatinine Ratio: 33 — ABNORMAL HIGH (ref 12–28)
BUN: 21 mg/dL (ref 8–27)
Bilirubin Total: 0.9 mg/dL (ref 0.0–1.2)
CO2: 24 mmol/L (ref 20–29)
Calcium: 9.3 mg/dL (ref 8.7–10.3)
Chloride: 99 mmol/L (ref 96–106)
Creatinine, Ser: 0.63 mg/dL (ref 0.57–1.00)
Globulin, Total: 2.6 g/dL (ref 1.5–4.5)
Glucose: 87 mg/dL (ref 70–99)
Potassium: 4.9 mmol/L (ref 3.5–5.2)
Sodium: 141 mmol/L (ref 134–144)
Total Protein: 7 g/dL (ref 6.0–8.5)
eGFR: 92 mL/min/{1.73_m2} (ref >59)

## 2021-11-05 LAB — APTT: aPTT: 28 s (ref 24–33)

## 2021-11-05 LAB — PROTIME-INR
INR: 0.9 (ref 0.9–1.2)
Prothrombin Time: 10 s (ref 9.1–12.0)

## 2021-11-05 LAB — HEMOGLOBIN A1C
Est. average glucose Bld gHb Est-mCnc: 120 mg/dL
Hgb A1c MFr Bld: 5.8 % — ABNORMAL HIGH (ref 4.8–5.6)

## 2022-02-14 ENCOUNTER — Telehealth: Payer: Self-pay | Admitting: General Practice

## 2022-02-14 NOTE — Telephone Encounter (Signed)
Spoke to patient to schedule her  awvi 03/18/17 per [facility]    . Patient declined not interested

## 2023-10-12 ENCOUNTER — Emergency Department (HOSPITAL_COMMUNITY)

## 2023-10-12 ENCOUNTER — Other Ambulatory Visit: Payer: Self-pay

## 2023-10-12 ENCOUNTER — Encounter (HOSPITAL_COMMUNITY): Payer: Self-pay

## 2023-10-12 ENCOUNTER — Inpatient Hospital Stay (HOSPITAL_COMMUNITY)
Admission: EM | Admit: 2023-10-12 | Discharge: 2023-10-19 | DRG: 377 | Disposition: A | Discharge status: Home / Self Care | Attending: Internal Medicine | Admitting: Internal Medicine

## 2023-10-12 DIAGNOSIS — Z681 Body mass index (BMI) 19 or less, adult: Secondary | ICD-10-CM

## 2023-10-12 DIAGNOSIS — E876 Hypokalemia: Secondary | ICD-10-CM | POA: Diagnosis not present

## 2023-10-12 DIAGNOSIS — R188 Other ascites: Secondary | ICD-10-CM | POA: Diagnosis not present

## 2023-10-12 DIAGNOSIS — Z1611 Resistance to penicillins: Secondary | ICD-10-CM | POA: Diagnosis present

## 2023-10-12 DIAGNOSIS — D509 Iron deficiency anemia, unspecified: Secondary | ICD-10-CM | POA: Diagnosis not present

## 2023-10-12 DIAGNOSIS — Z87891 Personal history of nicotine dependence: Secondary | ICD-10-CM | POA: Diagnosis not present

## 2023-10-12 DIAGNOSIS — K760 Fatty (change of) liver, not elsewhere classified: Secondary | ICD-10-CM | POA: Diagnosis present

## 2023-10-12 DIAGNOSIS — D62 Acute posthemorrhagic anemia: Secondary | ICD-10-CM | POA: Diagnosis not present

## 2023-10-12 DIAGNOSIS — K5721 Diverticulitis of large intestine with perforation and abscess with bleeding: Secondary | ICD-10-CM | POA: Diagnosis not present

## 2023-10-12 DIAGNOSIS — N858 Other specified noninflammatory disorders of uterus: Secondary | ICD-10-CM | POA: Diagnosis not present

## 2023-10-12 DIAGNOSIS — R1084 Generalized abdominal pain: Secondary | ICD-10-CM | POA: Diagnosis not present

## 2023-10-12 DIAGNOSIS — B9620 Unspecified Escherichia coli [E. coli] as the cause of diseases classified elsewhere: Secondary | ICD-10-CM | POA: Diagnosis present

## 2023-10-12 DIAGNOSIS — K5720 Diverticulitis of large intestine with perforation and abscess without bleeding: Secondary | ICD-10-CM | POA: Diagnosis present

## 2023-10-12 DIAGNOSIS — K56600 Partial intestinal obstruction, unspecified as to cause: Secondary | ICD-10-CM | POA: Diagnosis not present

## 2023-10-12 DIAGNOSIS — K5732 Diverticulitis of large intestine without perforation or abscess without bleeding: Secondary | ICD-10-CM | POA: Diagnosis not present

## 2023-10-12 DIAGNOSIS — E871 Hypo-osmolality and hyponatremia: Secondary | ICD-10-CM | POA: Diagnosis not present

## 2023-10-12 DIAGNOSIS — I700 Atherosclerosis of aorta: Secondary | ICD-10-CM | POA: Diagnosis present

## 2023-10-12 DIAGNOSIS — K921 Melena: Secondary | ICD-10-CM | POA: Insufficient documentation

## 2023-10-12 DIAGNOSIS — Z638 Other specified problems related to primary support group: Secondary | ICD-10-CM

## 2023-10-12 DIAGNOSIS — K3189 Other diseases of stomach and duodenum: Secondary | ICD-10-CM | POA: Diagnosis not present

## 2023-10-12 DIAGNOSIS — R627 Adult failure to thrive: Secondary | ICD-10-CM | POA: Diagnosis present

## 2023-10-12 DIAGNOSIS — K651 Peritoneal abscess: Secondary | ICD-10-CM | POA: Diagnosis not present

## 2023-10-12 DIAGNOSIS — D649 Anemia, unspecified: Secondary | ICD-10-CM | POA: Diagnosis present

## 2023-10-12 DIAGNOSIS — K5730 Diverticulosis of large intestine without perforation or abscess without bleeding: Secondary | ICD-10-CM | POA: Diagnosis not present

## 2023-10-12 DIAGNOSIS — K8020 Calculus of gallbladder without cholecystitis without obstruction: Secondary | ICD-10-CM | POA: Diagnosis not present

## 2023-10-12 DIAGNOSIS — R933 Abnormal findings on diagnostic imaging of other parts of digestive tract: Secondary | ICD-10-CM | POA: Diagnosis not present

## 2023-10-12 DIAGNOSIS — R54 Age-related physical debility: Secondary | ICD-10-CM | POA: Diagnosis present

## 2023-10-12 DIAGNOSIS — E869 Volume depletion, unspecified: Secondary | ICD-10-CM | POA: Diagnosis present

## 2023-10-12 DIAGNOSIS — K5780 Diverticulitis of intestine, part unspecified, with perforation and abscess without bleeding: Secondary | ICD-10-CM | POA: Diagnosis not present

## 2023-10-12 DIAGNOSIS — K922 Gastrointestinal hemorrhage, unspecified: Secondary | ICD-10-CM | POA: Diagnosis not present

## 2023-10-12 DIAGNOSIS — R109 Unspecified abdominal pain: Secondary | ICD-10-CM | POA: Diagnosis not present

## 2023-10-12 DIAGNOSIS — T85898A Other specified complication of other internal prosthetic devices, implants and grafts, initial encounter: Secondary | ICD-10-CM | POA: Diagnosis not present

## 2023-10-12 DIAGNOSIS — K5733 Diverticulitis of large intestine without perforation or abscess with bleeding: Secondary | ICD-10-CM | POA: Diagnosis not present

## 2023-10-12 LAB — URINALYSIS, ROUTINE W REFLEX MICROSCOPIC
Bacteria, UA: NONE SEEN
Bilirubin Urine: NEGATIVE
Glucose, UA: NEGATIVE mg/dL
Ketones, ur: 80 mg/dL — AB
Nitrite: NEGATIVE
Protein, ur: NEGATIVE mg/dL
Specific Gravity, Urine: 1.01 (ref 1.005–1.030)
pH: 6 (ref 5.0–8.0)

## 2023-10-12 LAB — CBC
HCT: 31.2 % — ABNORMAL LOW (ref 36.0–46.0)
Hemoglobin: 10.7 g/dL — ABNORMAL LOW (ref 12.0–15.0)
MCH: 31.8 pg (ref 26.0–34.0)
MCHC: 34.3 g/dL (ref 30.0–36.0)
MCV: 92.9 fL (ref 80.0–100.0)
Platelets: 530 10*3/uL — ABNORMAL HIGH (ref 150–400)
RBC: 3.36 MIL/uL — ABNORMAL LOW (ref 3.87–5.11)
RDW: 14 % (ref 11.5–15.5)
WBC: 25.8 10*3/uL — ABNORMAL HIGH (ref 4.0–10.5)
nRBC: 0 % (ref 0.0–0.2)

## 2023-10-12 LAB — COMPREHENSIVE METABOLIC PANEL WITH GFR
ALT: 13 U/L (ref 0–44)
AST: 16 U/L (ref 15–41)
Albumin: 2.3 g/dL — ABNORMAL LOW (ref 3.5–5.0)
Alkaline Phosphatase: 74 U/L (ref 38–126)
Anion gap: 13 (ref 5–15)
BUN: 11 mg/dL (ref 8–23)
CO2: 22 mmol/L (ref 22–32)
Calcium: 8.1 mg/dL — ABNORMAL LOW (ref 8.9–10.3)
Chloride: 93 mmol/L — ABNORMAL LOW (ref 98–111)
Creatinine, Ser: 0.55 mg/dL (ref 0.44–1.00)
GFR, Estimated: >60 mL/min (ref >60)
Glucose, Bld: 102 mg/dL — ABNORMAL HIGH (ref 70–99)
Potassium: 3.1 mmol/L — ABNORMAL LOW (ref 3.5–5.1)
Sodium: 128 mmol/L — ABNORMAL LOW (ref 135–145)
Total Bilirubin: 0.7 mg/dL (ref 0.0–1.2)
Total Protein: 5.9 g/dL — ABNORMAL LOW (ref 6.5–8.1)

## 2023-10-12 LAB — LACTIC ACID, PLASMA
Lactic Acid, Venous: 1.4 mmol/L (ref 0.5–1.9)
Lactic Acid, Venous: 0.9 mmol/L (ref 0.5–1.9)

## 2023-10-12 LAB — LIPASE, BLOOD: Lipase: 25 U/L (ref 11–51)

## 2023-10-12 MED ORDER — PIPERACILLIN-TAZOBACTAM 3.375 G IVPB 30 MIN
3.3750 g | Freq: Once | INTRAVENOUS | Status: AC
  Administered 2023-10-12: 3.375 g via INTRAVENOUS
  Filled 2023-10-12: qty 50

## 2023-10-12 MED ORDER — ONDANSETRON HCL 4 MG/2ML IJ SOLN: 4.0000 mg | Freq: Four times a day (QID) | INTRAMUSCULAR | Status: DC | PRN

## 2023-10-12 MED ORDER — LACTATED RINGERS IV BOLUS
1000.0000 mL | Freq: Once | INTRAVENOUS | Status: AC
  Administered 2023-10-12: 1000 mL via INTRAVENOUS

## 2023-10-12 MED ORDER — ACETAMINOPHEN 325 MG PO TABS: 650.0000 mg | ORAL_TABLET | Freq: Four times a day (QID) | ORAL | Status: DC | PRN

## 2023-10-12 MED ORDER — IOHEXOL 300 MG/ML  SOLN
80.0000 mL | Freq: Once | INTRAMUSCULAR | Status: AC | PRN
  Administered 2023-10-12: 80 mL via INTRAVENOUS

## 2023-10-12 MED ORDER — PIPERACILLIN-TAZOBACTAM 3.375 G IVPB
3.3750 g | Freq: Three times a day (TID) | INTRAVENOUS | Status: DC
  Administered 2023-10-13 – 2023-10-16 (×10): 3.375 g via INTRAVENOUS
  Filled 2023-10-12 (×10): qty 50

## 2023-10-12 MED ORDER — ONDANSETRON HCL 4 MG PO TABS: 4.0000 mg | ORAL_TABLET | Freq: Four times a day (QID) | ORAL | Status: DC | PRN

## 2023-10-12 MED ORDER — MORPHINE SULFATE (PF) 2 MG/ML IV SOLN
1.0000 mg | INTRAVENOUS | Status: DC | PRN
  Administered 2023-10-12 – 2023-10-13 (×2): 1 mg via INTRAVENOUS
  Filled 2023-10-12 (×3): qty 1

## 2023-10-12 MED ORDER — LACTATED RINGERS IV SOLN: INTRAVENOUS | Status: DC

## 2023-10-12 MED ORDER — ACETAMINOPHEN 650 MG RE SUPP: 650.0000 mg | Freq: Four times a day (QID) | RECTAL | Status: DC | PRN

## 2023-10-12 MED ORDER — POTASSIUM CHLORIDE 10 MEQ/100ML IV SOLN
10.0000 meq | INTRAVENOUS | Status: AC
  Administered 2023-10-12 (×3): 10 meq via INTRAVENOUS
  Filled 2023-10-12 (×2): qty 100

## 2023-10-12 MED ORDER — IOHEXOL 300 MG/ML  SOLN
30.0000 mL | Freq: Once | INTRAMUSCULAR | Status: AC | PRN
  Administered 2023-10-12: 30 mL via ORAL

## 2023-10-12 NOTE — Hospital Course (Signed)
 Susan Fletcher is a 78 y.o. female with no known significant medical history who is admitted with severe sigmoid diverticulitis with multiple associated pericolonic abscesses.

## 2023-10-12 NOTE — Progress Notes (Signed)
 Pharmacy Antibiotic Note  Susan Fletcher is a 78 y.o. female admitted on 10/12/2023 with [facility]AI.  Pharmacy has been consulted for Zosyn dosing.  Plan: Zosyn 3.375g IV q8h (4 hour infusion).  Height: 5\' 1"  (154.9 cm) Weight: 40.6 kg (89 lb 9.6 oz) IBW/kg (Calculated) : 47.8  Temp (24hrs), Avg:98.1 F (36.7 C), Min:98 F (36.7 C), Max:98.2 F (36.8 C)  Recent Labs  Lab 10/12/23 1351 10/12/23 1643  WBC 25.8*  --   CREATININE 0.55  --   LATICACIDVEN  --  1.4    Estimated Creatinine Clearance: 37.1 mL/min (by C-G formula based on SCr of 0.55 mg/dL).    No Known Allergies  Dosage will likely remain stable at above dosage and need for further dosage adjustment appears unlikely at present.    Will sign off at this time.  Please reconsult if a change in clinical status warrants re-evaluation of dosage.    Adalberto Cole, PharmD, BCPS 10/12/2023 8:39 PM

## 2023-10-12 NOTE — ED Triage Notes (Signed)
 Generalized abdominal pain that is sharp, worse at night for 3 weeks. Denies N/V/D. Pt states she had slight relief with gasx and mint tea.

## 2023-10-12 NOTE — Progress Notes (Signed)
 Pharmacy Note   A consult was received from an ED physician for Zosyn per pharmacy dosing.    The patient's profile has been reviewed for ht/wt/allergies/indication/available labs.    A one time order has been placed for Zosyn 3.375 gr IV x1 .    Further antibiotics/pharmacy consults should be ordered by admitting physician if indicated.                       Thank you,  Adalberto Cole, PharmD, BCPS 10/12/2023 6:35 PM

## 2023-10-12 NOTE — H&P (Signed)
 History and Physical    Susan Fletcher ZOX:[id number] DOB: [birth year] DOA: 10/12/2023  PCP: Patient, No Pcp Per  Patient coming from: Home  I have personally briefly reviewed patient's old medical records in [facility]  Chief Complaint: Abdominal pain  HPI: Susan Fletcher is a 78 y.o. female with no known significant medical history who presented to the ED for evaluation of abdominal pain.  Patient reports about 3 weeks ago she woke from sleep around midnight with severe lower abdominal pain.  Pain felt like "knives stabbing" her in the abdomen.  This intense pain lasted about 2 days before she had some improvement.  She did not seek medical attention at that time.  She says since then she has had on and off episodes of recurrent severe abdominal pain although she has had some constant baseline pain.  For the last couple days she developed severe pain again.  She has had associated chills and diaphoresis.  She reports low appetite and significant decreased oral intake.  She says she has not had any bowel movements for quite some time.  She says she is not passing flatus.  She has not had a previous colonoscopy.  She denies any prior abdominal surgeries.  She does not take any medications except for occasional Excedrin for headache.  ED Course  Labs/Imaging on admission: I have personally reviewed following labs and imaging studies.  Initial vitals showed BP 120/65, pulse 98, RR 17, temp 98.2 F, SpO2 100% on room air.  Labs show WBC 25.8, hemoglobin 10.7, platelets 530,000, sodium 120, potassium 3.1, bicarb 22, BUN 11, creatinine 0.55, serum glucose 102, LFTs within normal limits, lipase 25.  Lactic acid 1.4.  UA showed 80 ketones, negative nitrites, small leukocytes, 0-5 RBCs and WBCs, no bacteria.  Blood cultures in process.  CT abdomen/pelvis with contrast showed severe sigmoid diverticulitis with multiple pericolonic abscesses.  Largest measures 6.3 x 2.8 cm anteriorly in the  pelvis.  Another measured 3.6 x 3.1 cm posteriorly.  Mild dilatation of more proximal colon is noted suggesting partial obstruction secondary to this inflammation.  Hepatic steatosis and mild cholelithiasis also noted.  Patient was given 1 L LR, IV Zosyn, IV K 10 mEq x 4.  EDP spoke with general surgery (Dr. Maisie Fus) who recommended medical admission and will see in consultation today.  The hospitalist service was consulted to admit.  Review of Systems: All systems reviewed and are negative except as documented in history of present illness above.   Past Medical History:  Diagnosis Date   Headache     Past Surgical History:  Procedure Laterality Date   DILATION AND CURETTAGE, DIAGNOSTIC / THERAPEUTIC     ORIF WRIST FRACTURE Right 04/30/2018   Procedure: OPEN REDUCTION INTERNAL FIXATION (ORIF) RIGHT DISTAL RADIUS;  Surgeon: [name], MD;  Location: [facility];  Service: Orthopedics;  Laterality: Right;    Social History: Patient reports she quit smoking about 20 years ago.  She denies alcohol use.  No Known Allergies  Family History  Problem Relation Age of Onset   Healthy Mother      Prior to Admission medications   Medication Sig Start Date End Date Taking? Authorizing Provider  aspirin-acetaminophen-caffeine (EXCEDRIN MIGRAINE) [phone] MG tablet Take 1 tablet by mouth every 6 (six) hours as needed for headache.    [provider]    Physical Exam: Vitals:   10/12/23 1306 10/12/23 1314 10/12/23 1600 10/12/23 1723  BP: 120/65  116/60   Pulse:  98  86   Resp: 17  18   Temp: 98.2 F (36.8 C)   98 F (36.7 C)  TempSrc: Oral   Oral  SpO2: 100%  100%   Weight:  40.6 kg    Height:  5\' 1"  (1.549 m)     Constitutional: Thin woman resting in bed, NAD, calm, comfortable Eyes: EOMI, lids and conjunctivae normal ENMT: Mucous membranes are moist. Posterior pharynx clear of any exudate or lesions.Normal dentition.  Neck: normal, supple, no  masses. Respiratory: clear to auscultation bilaterally, no wheezing, no crackles. Normal respiratory effort. No accessory muscle use.  Cardiovascular: Regular rate and rhythm, no murmurs / rubs / gallops. No extremity edema. 2+ pedal pulses. Abdomen: no significant tenderness on palpation. Musculoskeletal: no clubbing / cyanosis. No joint deformity upper and lower extremities. Good ROM, no contractures. Normal muscle tone.  Skin: no rashes, lesions, ulcers. No induration Neurologic: Sensation intact. Strength 5/5 in all 4.  Psychiatric: Normal judgment and insight. Alert and oriented x 3. Normal mood.   EKG: Not performed.  Assessment/Plan Principal Problem:   Abscess of sigmoid colon due to diverticulitis Active Problems:   Hypokalemia   Hyponatremia   Normocytic anemia   Susan Fletcher is a 78 y.o. female with no known significant medical history who is admitted with severe sigmoid diverticulitis with multiple associated pericolonic abscesses.  Assessment and Plan: Severe sigmoid diverticulitis with multiple pericolonic abscesses: Seen on CT imaging with symptoms for 3 weeks.  There is concern for associated partial colonic obstruction due to the inflammation. -Keep n.p.o. -Continue IV Zosyn -Continue IV fluid hydration overnight -Continue IV antiemetics and analgesics as needed -Further recommendations per general surgery  Hypokalemia: IV replacement in process.  Hyponatremia: Mild and secondary to volume depletion.  Continue IV fluid hydration overnight.  Normocytic anemia: Mild without obvious bleeding.  Continue to monitor.   DVT prophylaxis: SCDs Start: 10/12/23 1939 Code Status: Full code, confirmed with patient on admission Family Communication: Patient states she has no close family Disposition Plan: From home and likely discharge to home pending clinical progress Consults called: General Surgery Severity of Illness: The appropriate patient status for this  patient is INPATIENT. Inpatient status is judged to be reasonable and necessary in order to provide the required intensity of service to ensure the patient's safety. The patient's presenting symptoms, physical exam findings, and initial radiographic and laboratory data in the context of their chronic comorbidities is felt to place them at high risk for further clinical deterioration. Furthermore, it is not anticipated that the patient will be medically stable for discharge from the hospital within 2 midnights of admission.   * I certify that at the point of admission it is my clinical judgment that the patient will require inpatient hospital care spanning beyond 2 midnights from the point of admission due to high intensity of service, high risk for further deterioration and high frequency of surveillance required.Darreld Mclean MD [facility]  If 7PM-7AM, please contact night-coverage [email or web address]  10/12/2023, 7:44 PM

## 2023-10-12 NOTE — ED Notes (Signed)
 Patient came out of the bathroom with arm bleeding. NT seen the IV was all the way out, cleaned patients arm up and applied gauze with tape.

## 2023-10-12 NOTE — Consult Note (Addendum)
 CC: abd pain  Requesting provider: Dr Allena Katz  HPI: Susan Fletcher is an 78 y.o. female who is here for worsening abd pain.  She states that ~3 wks ago she developed severe abd pain after working in her yard.  This then turned into "flu-like symptoms" for about 2 days and then transitioned into episodes of abd pain, abnormal bowel habits and malaise.  She presented to the ED today due to failure to thrive.    Past Medical History:  Diagnosis Date   Headache     Past Surgical History:  Procedure Laterality Date   DILATION AND CURETTAGE, DIAGNOSTIC / THERAPEUTIC     ORIF WRIST FRACTURE Right 04/30/2018   Procedure: OPEN REDUCTION INTERNAL FIXATION (ORIF) RIGHT DISTAL RADIUS;  Surgeon: [name], MD;  Location: [facility];  Service: Orthopedics;  Laterality: Right;    Family History  Problem Relation Age of Onset   Healthy Mother     Social:  reports that she has quit smoking. Her smoking use included cigarettes. She has never used smokeless tobacco. She reports current alcohol use. She reports that she does not use drugs.  Allergies: No Known Allergies  Medications: I have reviewed the patient's current medications.  Results for orders placed or performed during the hospital encounter of 10/12/23 (from the past 48 hours)  Lipase, blood     Status: None   Collection Time: 10/12/23  1:51 PM  Result Value Ref Range   Lipase 25 11 - 51 U/L    Comment: Performed at [facility], [address], [place], [place] [address]  Comprehensive metabolic panel     Status: Abnormal   Collection Time: 10/12/23  1:51 PM  Result Value Ref Range   Sodium 128 (L) 135 - 145 mmol/L   Potassium 3.1 (L) 3.5 - 5.1 mmol/L   Chloride 93 (L) 98 - 111 mmol/L   CO2 22 22 - 32 mmol/L   Glucose, Bld 102 (H) 70 - 99 mg/dL    Comment: Glucose reference range applies only to samples taken after fasting for at least 8 hours.   BUN 11 8 - 23 mg/dL   Creatinine, Ser  6.04 0.44 - 1.00 mg/dL   Calcium 8.1 (L) 8.9 - 10.3 mg/dL   Total Protein 5.9 (L) 6.5 - 8.1 g/dL   Albumin 2.3 (L) 3.5 - 5.0 g/dL   AST 16 15 - 41 U/L   ALT 13 0 - 44 U/L   Alkaline Phosphatase 74 38 - 126 U/L   Total Bilirubin 0.7 0.0 - 1.2 mg/dL   GFR, Estimated >54 >09 mL/min    Comment: (NOTE) Calculated using the CKD-EPI Creatinine Equation (2021)    Anion gap 13 5 - 15    Comment: Performed at [facility], [address], [place], [place] [address]  CBC     Status: Abnormal   Collection Time: 10/12/23  1:51 PM  Result Value Ref Range   WBC 25.8 (H) 4.0 - 10.5 K/uL   RBC 3.36 (L) 3.87 - 5.11 MIL/uL   Hemoglobin 10.7 (L) 12.0 - 15.0 g/dL   HCT 47.8 (L) 29.5 - 62.1 %   MCV 92.9 80.0 - 100.0 fL   MCH 31.8 26.0 - 34.0 pg   MCHC 34.3 30.0 - 36.0 g/dL   RDW 30.8 65.7 - 84.6 %   Platelets 530 (H) 150 - 400 K/uL   nRBC 0.0 0.0 - 0.2 %    Comment:  Performed at [facility], [address], [place], [place] [address]  Urinalysis, Routine w reflex microscopic -Urine, Clean Catch     Status: Abnormal   Collection Time: 10/12/23  3:27 PM  Result Value Ref Range   Color, Urine YELLOW YELLOW   APPearance CLEAR CLEAR   Specific Gravity, Urine 1.010 1.005 - 1.030   pH 6.0 5.0 - 8.0   Glucose, UA NEGATIVE NEGATIVE mg/dL   Hgb urine dipstick MODERATE (A) NEGATIVE   Bilirubin Urine NEGATIVE NEGATIVE   Ketones, ur 80 (A) NEGATIVE mg/dL   Protein, ur NEGATIVE NEGATIVE mg/dL   Nitrite NEGATIVE NEGATIVE   Leukocytes,Ua SMALL (A) NEGATIVE   RBC / HPF 0-5 0 - 5 RBC/hpf   WBC, UA 0-5 0 - 5 WBC/hpf   Bacteria, UA NONE SEEN NONE SEEN   Squamous Epithelial / HPF 0-5 0 - 5 /HPF   Mucus PRESENT     Comment: Performed at [facility], [address] [place], [place] [address]  Lactic acid, plasma     Status: None   Collection Time: 10/12/23  4:43 PM  Result Value Ref Range   Lactic Acid, Venous 1.4 0.5 - 1.9 mmol/L    Comment:  Performed at [facility], [address] [place], [place] [address]    CT ABDOMEN PELVIS W CONTRAST Result Date: 10/12/2023 CLINICAL DATA:  Acute generalized abdominal pain. EXAM: CT ABDOMEN AND PELVIS WITH CONTRAST TECHNIQUE: Multidetector CT imaging of the abdomen and pelvis was performed using the standard protocol following bolus administration of intravenous contrast. RADIATION DOSE REDUCTION: This exam was performed according to the departmental dose-optimization program which includes automated exposure control, adjustment of the mA and/or kV according to patient size and/or use of iterative reconstruction technique. CONTRAST:  80mL OMNIPAQUE IOHEXOL 300 MG/ML  SOLN COMPARISON:  None Available. FINDINGS: Lower chest: No acute abnormality. Hepatobiliary: Hepatic steatosis is noted. Mild cholelithiasis is noted. No biliary dilatation is noted. Pancreas: Unremarkable. No pancreatic ductal dilatation or surrounding inflammatory changes. Spleen: Normal in size without focal abnormality. Adrenals/Urinary Tract: Adrenal glands are unremarkable. Kidneys are normal, without renal calculi, focal lesion, or hydronephrosis. Bladder is unremarkable. Stomach/Bowel: The stomach is unremarkable. There is no evidence of small bowel dilatation. The appendix is not clearly visualized. There appears to be severe sigmoid diverticulitis present with multiple small pericolonic abscesses. The largest measures 6.3 x 2.8 cm best seen on image number 56 of series 8. Probable 3.6 x 3.1 cm abscess is noted posteriorly in the pelvis. There appears to be mild dilatation of the more proximal colon suggesting partial obstruction secondary to this inflammation. Vascular/Lymphatic: Aortic atherosclerosis. No enlarged abdominal or pelvic lymph nodes. Reproductive: Intrauterine device is noted. Adnexal regions are not well visualized due to previously described inflammation in the pelvis. Other: No definite hernia is  noted. Musculoskeletal: No acute or significant osseous findings. IMPRESSION: Severe sigmoid diverticulitis is noted with multiple pericolonic abscess is present. The largest measures 6.3 x 2.8 cm anteriorly in the pelvis. Another measures 3.6 x 3.1 cm posteriorly. Mild dilatation of more proximal colon is noted suggesting partial obstruction secondary to this inflammation. Hepatic steatosis. Mild cholelithiasis. Aortic Atherosclerosis (ICD10-I70.0). Electronically Signed   By: Lupita Raider M.D.   On: 10/12/2023 17:51    ROS - all of the below systems have been reviewed with the patient and positives are indicated with bold text General: chills, fever or night sweats Eyes: blurry vision or double vision ENT: epistaxis  or sore throat Hematologic/Lymphatic: bleeding problems, blood clots or swollen lymph nodes Endocrine: temperature intolerance or unexpected weight changes Breast: new or changing breast lumps or nipple discharge Resp: cough, shortness of breath, or wheezing CV: chest pain or dyspnea on exertion GI: as per HPI GU: dysuria, trouble voiding, or hematuria Neuro: TIA or stroke symptoms    PE Blood pressure 116/60, pulse 86, temperature 98 F (36.7 C), temperature source Oral, resp. rate 18, height 5\' 1"  (1.549 m), weight 40.6 kg, SpO2 100%. Constitutional: NAD; conversant; no deformities Eyes: Moist conjunctiva; no lid lag; anicteric; PERRL Neck: Trachea midline; no thyromegaly Lungs: Normal respiratory effort CV: RRR GI: Abd soft, distended. Mild TTP diffusely MSK: Normal range of motion of extremities; no clubbing/cyanosis Psychiatric: Appropriate affect; alert and oriented x3  Results for orders placed or performed during the hospital encounter of 10/12/23 (from the past 48 hours)  Lipase, blood     Status: None   Collection Time: 10/12/23  1:51 PM  Result Value Ref Range   Lipase 25 11 - 51 U/L    Comment: Performed at [facility], [address], [place], [place] [address]  Comprehensive metabolic panel     Status: Abnormal   Collection Time: 10/12/23  1:51 PM  Result Value Ref Range   Sodium 128 (L) 135 - 145 mmol/L   Potassium 3.1 (L) 3.5 - 5.1 mmol/L   Chloride 93 (L) 98 - 111 mmol/L   CO2 22 22 - 32 mmol/L   Glucose, Bld 102 (H) 70 - 99 mg/dL    Comment: Glucose reference range applies only to samples taken after fasting for at least 8 hours.   BUN 11 8 - 23 mg/dL   Creatinine, Ser 1.30 0.44 - 1.00 mg/dL   Calcium 8.1 (L) 8.9 - 10.3 mg/dL   Total Protein 5.9 (L) 6.5 - 8.1 g/dL   Albumin 2.3 (L) 3.5 - 5.0 g/dL   AST 16 15 - 41 U/L   ALT 13 0 - 44 U/L   Alkaline Phosphatase 74 38 - 126 U/L   Total Bilirubin 0.7 0.0 - 1.2 mg/dL   GFR, Estimated >86 >57 mL/min    Comment: (NOTE) Calculated using the CKD-EPI Creatinine Equation (2021)    Anion gap 13 5 - 15    Comment: Performed at [facility], [address], [place], [place] [address]  CBC     Status: Abnormal   Collection Time: 10/12/23  1:51 PM  Result Value Ref Range   WBC 25.8 (H) 4.0 - 10.5 K/uL   RBC 3.36 (L) 3.87 - 5.11 MIL/uL   Hemoglobin 10.7 (L) 12.0 - 15.0 g/dL   HCT 29.5 (L) 28.4 - 13.2 %   MCV 92.9 80.0 - 100.0 fL   MCH 31.8 26.0 - 34.0 pg   MCHC 34.3 30.0 - 36.0 g/dL   RDW 44.0 10.2 - 72.5 %   Platelets 530 (H) 150 - 400 K/uL   nRBC 0.0 0.0 - 0.2 %    Comment: Performed at [facility], [address] [place], [place] [address]  Urinalysis, Routine w reflex microscopic -Urine, Clean Catch     Status: Abnormal   Collection Time: 10/12/23  3:27 PM  Result Value Ref Range   Color, Urine YELLOW YELLOW   APPearance CLEAR CLEAR   Specific Gravity, Urine 1.010 1.005 - 1.030   pH 6.0 5.0 - 8.0   Glucose, UA NEGATIVE NEGATIVE mg/dL   Hgb urine  dipstick MODERATE (A) NEGATIVE   Bilirubin Urine NEGATIVE NEGATIVE   Ketones, ur 80 (A) NEGATIVE mg/dL   Protein, ur NEGATIVE NEGATIVE mg/dL   Nitrite NEGATIVE  NEGATIVE   Leukocytes,Ua SMALL (A) NEGATIVE   RBC / HPF 0-5 0 - 5 RBC/hpf   WBC, UA 0-5 0 - 5 WBC/hpf   Bacteria, UA NONE SEEN NONE SEEN   Squamous Epithelial / HPF 0-5 0 - 5 /HPF   Mucus PRESENT     Comment: Performed at [facility], [address], [place], [place] [address]  Lactic acid, plasma     Status: None   Collection Time: 10/12/23  4:43 PM  Result Value Ref Range   Lactic Acid, Venous 1.4 0.5 - 1.9 mmol/L    Comment: Performed at [facility], [address] [place], [place] [address]    CT ABDOMEN PELVIS W CONTRAST Result Date: 10/12/2023 CLINICAL DATA:  Acute generalized abdominal pain. EXAM: CT ABDOMEN AND PELVIS WITH CONTRAST TECHNIQUE: Multidetector CT imaging of the abdomen and pelvis was performed using the standard protocol following bolus administration of intravenous contrast. RADIATION DOSE REDUCTION: This exam was performed according to the departmental dose-optimization program which includes automated exposure control, adjustment of the mA and/or kV according to patient size and/or use of iterative reconstruction technique. CONTRAST:  80mL OMNIPAQUE IOHEXOL 300 MG/ML  SOLN COMPARISON:  None Available. FINDINGS: Lower chest: No acute abnormality. Hepatobiliary: Hepatic steatosis is noted. Mild cholelithiasis is noted. No biliary dilatation is noted. Pancreas: Unremarkable. No pancreatic ductal dilatation or surrounding inflammatory changes. Spleen: Normal in size without focal abnormality. Adrenals/Urinary Tract: Adrenal glands are unremarkable. Kidneys are normal, without renal calculi, focal lesion, or hydronephrosis. Bladder is unremarkable. Stomach/Bowel: The stomach is unremarkable. There is no evidence of small bowel dilatation. The appendix is not clearly visualized. There appears to be severe sigmoid diverticulitis present with multiple small pericolonic abscesses. The largest measures 6.3 x 2.8 cm best seen on image number 56 of  series 8. Probable 3.6 x 3.1 cm abscess is noted posteriorly in the pelvis. There appears to be mild dilatation of the more proximal colon suggesting partial obstruction secondary to this inflammation. Vascular/Lymphatic: Aortic atherosclerosis. No enlarged abdominal or pelvic lymph nodes. Reproductive: Intrauterine device is noted. Adnexal regions are not well visualized due to previously described inflammation in the pelvis. Other: No definite hernia is noted. Musculoskeletal: No acute or significant osseous findings. IMPRESSION: Severe sigmoid diverticulitis is noted with multiple pericolonic abscess is present. The largest measures 6.3 x 2.8 cm anteriorly in the pelvis. Another measures 3.6 x 3.1 cm posteriorly. Mild dilatation of more proximal colon is noted suggesting partial obstruction secondary to this inflammation. Hepatic steatosis. Mild cholelithiasis. Aortic Atherosclerosis (ICD10-I70.0). Electronically Signed   By: Lupita Raider M.D.   On: 10/12/2023 17:51   CT images and report reviewed.  Labwork reviewed  A/P: Susan Fletcher is an 78 y.o. female with abd pain and multiple abscesses seen on CT scan.  Most likely, inciting event was 3 wks ago with initial symptoms, and she has developed these semi-chronic appearing abscesses.   Rec NPO, IVF's and IV antibiotics for now.  She is hemodynamically stable and surgery is not indicated at the moment.  We will follow with you and she will need IR evaluation in AM for larger abscess.   Vanita Panda, MD  Colorectal and General Surgery [facility] Surgery  Medical decision making was moderate.

## 2023-10-12 NOTE — ED Provider Notes (Signed)
 [place] [facility]MENT AT [facility] Provider Note   CSN: [id number] Arrival date & time: 10/12/23  1300     History Chief Complaint  Patient presents with   Abdominal Pain    Susan Fletcher is a 78 y.o. female self reportedly otherwise healthy presents emerged part today for evaluation of 3 weeks of lower abdominal pain. She reports that she has also felt bloated at this time.   Patient reports that the pain did start after she was raking some leaves so she was concerned that she has inhaled some mold that was causing her belly pain.  She reports it is cramping but then will feel sharp in nature.  She reports that her last normal bowel movement was around 2 weeks ago.  She has been having bowel movements however reports they are small.  She denies to the stool is hard however reports that she is having difficulty with defecation.  The stool is not black or bloody.  She has not had any any trouble with urination.  Denies any fevers.  Denies any nausea or vomiting, but is having decrease in appetite.  She reports that eating does worsen her belly pain.  She reports that she is only been able to tolerate cream of wheat, some juice, and toast.  She reports that she was able to have a bowel movement and passed some flatulus which did improve her pain however it did return.  She reports that she was passing some white fluid from her rectum when to have a bowel movement earlier.  She has tried mint tea and other various teas as well as some Gas-X without much relief.  She has never had a colonoscopy.  She denies any fevers or vaginal symptoms.  Denies any surgeries to her abdomen.  Denies any other medications.  No known drug allergies.  Denies any tobacco, EtOH, other drug use.   Abdominal Pain Associated symptoms: no chest pain, no chills, no dysuria, no fever, no hematuria, no nausea, no shortness of breath, no vaginal bleeding, no vaginal discharge and no vomiting        Home  Medications Prior to Admission medications   Medication Sig Start Date End Date Taking? Authorizing Provider  aspirin-acetaminophen-caffeine (EXCEDRIN MIGRAINE) [phone] MG tablet Take 1 tablet by mouth every 6 (six) hours as needed for headache.    [provider]      Allergies    Patient has no known allergies.    Review of Systems   Review of Systems  Constitutional:  Positive for appetite change. Negative for chills and fever.  Respiratory:  Negative for shortness of breath.   Cardiovascular:  Negative for chest pain.  Gastrointestinal:  Positive for abdominal pain. Negative for blood in stool, nausea and vomiting.  Genitourinary:  Negative for dysuria, hematuria, vaginal bleeding, vaginal discharge and vaginal pain.  SEE HPI  Physical Exam Updated Vital Signs BP 116/60   Pulse 86   Temp 98 F (36.7 C) (Oral)   Resp 18   Ht 5\' 1"  (1.549 m)   Wt 40.6 kg   SpO2 100%   BMI 16.93 kg/m  Physical Exam Vitals and nursing note reviewed.  Constitutional:      Appearance: She is not toxic-appearing.     Comments: Petite older female that appears uncomfortable but not toxic appearing.  Pleasant.  HENT:     Mouth/Throat:     Comments: Dry mucous membranes. Cardiovascular:     Rate and Rhythm: Normal  rate.  Pulmonary:     Effort: Pulmonary effort is normal. No respiratory distress.  Abdominal:     General: Bowel sounds are increased. There is distension.     Palpations: Abdomen is soft.     Tenderness: There is generalized abdominal tenderness. There is no guarding or rebound.     Comments: Abdomen is slightly distended however still soft.  She has hyperactive bowel sounds.  Has some generalized tenderness but no guarding or rebound.  No overlying skin changes noted.  Skin:    General: Skin is warm and dry.  Neurological:     Mental Status: She is alert.     ED Results / Procedures / Treatments   Labs (all labs ordered are listed, but only abnormal results  are displayed) Labs Reviewed  COMPREHENSIVE METABOLIC PANEL WITH GFR - Abnormal; Notable for the following components:      Result Value   Sodium 128 (*)    Potassium 3.1 (*)    Chloride 93 (*)    Glucose, Bld 102 (*)    Calcium 8.1 (*)    Total Protein 5.9 (*)    Albumin 2.3 (*)    All other components within normal limits  CBC - Abnormal; Notable for the following components:   WBC 25.8 (*)    RBC 3.36 (*)    Hemoglobin 10.7 (*)    HCT 31.2 (*)    Platelets 530 (*)    All other components within normal limits  URINALYSIS, ROUTINE W REFLEX MICROSCOPIC - Abnormal; Notable for the following components:   Hgb urine dipstick MODERATE (*)    Ketones, ur 80 (*)    Leukocytes,Ua SMALL (*)    All other components within normal limits  CULTURE, BLOOD (ROUTINE X 2)  CULTURE, BLOOD (ROUTINE X 2)  LIPASE, BLOOD  LACTIC ACID, PLASMA  LACTIC ACID, PLASMA    EKG None  Radiology CT ABDOMEN PELVIS W CONTRAST Result Date: 10/12/2023 CLINICAL DATA:  Acute generalized abdominal pain. EXAM: CT ABDOMEN AND PELVIS WITH CONTRAST TECHNIQUE: Multidetector CT imaging of the abdomen and pelvis was performed using the standard protocol following bolus administration of intravenous contrast. RADIATION DOSE REDUCTION: This exam was performed according to the departmental dose-optimization program which includes automated exposure control, adjustment of the mA and/or kV according to patient size and/or use of iterative reconstruction technique. CONTRAST:  80mL OMNIPAQUE IOHEXOL 300 MG/ML  SOLN COMPARISON:  None Available. FINDINGS: Lower chest: No acute abnormality. Hepatobiliary: Hepatic steatosis is noted. Mild cholelithiasis is noted. No biliary dilatation is noted. Pancreas: Unremarkable. No pancreatic ductal dilatation or surrounding inflammatory changes. Spleen: Normal in size without focal abnormality. Adrenals/Urinary Tract: Adrenal glands are unremarkable. Kidneys are normal, without renal calculi,  focal lesion, or hydronephrosis. Bladder is unremarkable. Stomach/Bowel: The stomach is unremarkable. There is no evidence of small bowel dilatation. The appendix is not clearly visualized. There appears to be severe sigmoid diverticulitis present with multiple small pericolonic abscesses. The largest measures 6.3 x 2.8 cm best seen on image number 56 of series 8. Probable 3.6 x 3.1 cm abscess is noted posteriorly in the pelvis. There appears to be mild dilatation of the more proximal colon suggesting partial obstruction secondary to this inflammation. Vascular/Lymphatic: Aortic atherosclerosis. No enlarged abdominal or pelvic lymph nodes. Reproductive: Intrauterine device is noted. Adnexal regions are not well visualized due to previously described inflammation in the pelvis. Other: No definite hernia is noted. Musculoskeletal: No acute or significant osseous findings. IMPRESSION: Severe sigmoid diverticulitis is noted  with multiple pericolonic abscess is present. The largest measures 6.3 x 2.8 cm anteriorly in the pelvis. Another measures 3.6 x 3.1 cm posteriorly. Mild dilatation of more proximal colon is noted suggesting partial obstruction secondary to this inflammation. Hepatic steatosis. Mild cholelithiasis. Aortic Atherosclerosis (ICD10-I70.0). Electronically Signed   By: Lupita Raider M.D.   On: 10/12/2023 17:51    Procedures Procedures   Medications Ordered in ED Medications  potassium chloride 10 mEq in 100 mL IVPB (0 mEq Intravenous Stopped 10/12/23 1909)  piperacillin-tazobactam (ZOSYN) IVPB 3.375 g (3.375 g Intravenous New Bag/Given 10/12/23 1909)  iohexol (OMNIPAQUE) 300 MG/ML solution 30 mL (30 mLs Oral Contrast Given 10/12/23 1422)  lactated ringers bolus 1,000 mL (0 mLs Intravenous Stopped 10/12/23 1630)  iohexol (OMNIPAQUE) 300 MG/ML solution 80 mL (80 mLs Intravenous Contrast Given 10/12/23 1643)    ED Course/ Medical Decision Making/ A&P Clinical Course as of 10/12/23 1922  Thu Oct 12, 2023  1412 Spoke to CT, [place], about oral contrast order. He verbalizes confirmation.  [RR]  1831 General surgery paged.  [RR]  1836 Spoke with Dr. Maisie Fus. Recommending medical admission. They will come to see her tonight.  [RR]    Clinical Course User Index [RR] Achille Rich, PA-C                                Medical Decision Making Amount and/or Complexity of Data Reviewed Labs: ordered. Radiology: ordered.  Risk Prescription drug management. Decision regarding hospitalization.   78 y.o. female presents to the ER for evaluation of abdominal pain for three weeks. Differential diagnosis includes but is not limited to AAA, mesenteric ischemia, appendicitis, diverticulitis, DKA, gastroenteritis, nephrolithiasis, pancreatitis, constipation, UTI, bowel obstruction, biliary disease, IBD, PUD, hepatitis. Vital signs unremarkable. Physical exam as noted above.   On previous chart evaluation, there is only orthopedic notes present.  I do not see any primary care notes.  I independently reviewed and interpreted the patient's labs.  CMP shows sodium of 128, potassium 3 and 1.  Chloride 93 with glucose of 102.  Mildly decreased calcium, total protein, and albumin.  No other electrolyte or LFT abnormalities.  Unfortunately, I do not have any previous labs to compare this to.  Lipase within normal limits.  CBC does show significant leukocytosis with a white count of 25.8.  Hemoglobin of 10.7 with hematocrit 31.2.  Platelets increased at 530.  Lactic acid within normal limits at 1.4.  Urinalysis shows mod amount hemoglobin with 80 ketones present with small amount leukocytes but no white blood cells or bacteria present.  Blood cultures pending.  Patient has a normal anion gap.  Will start the patient on some LR for rehydration.  I think her electrolyte abnormalities are from decreased p.o. intake.  I will also replenish her potassium with some p.o. intake as well as IV runs.  CT imaging delayed  given the patient's size and need for PO contrast.   CT Abd shows severe sigmoid diverticulitis is noted with multiple pericolonic abscess is present. The largest measures 6.3 x 2.8 cm anteriorly in the pelvis. Another measures 3.6 x 3.1 cm posteriorly. Mild dilatation of more proximal colon is noted suggesting partial obstruction secondary to this inflammation. Hepatic steatosis. Mild cholelithiasis. Aortic Atherosclerosis   Given CT imaging findings, general surgery was paged.  I have started the patient on Zosyn after speaking with pharmacy.  I spoke with Dr. Maisie Fus with general surgery.  They are recommending medical admission and will comes here tonight.  Agrees with treating with Zosyn.  No other recommendations at this time.  I reviewed the results and lab imagings with the patient at bedside.  She is agreeable to admission.  No complaints at this time.  Will admit to [facility] hospitalist.  Dr. Allena Katz to admit.  Portions of this report may have been transcribed using voice recognition software. Every effort was made to ensure accuracy; however, inadvertent computerized transcription errors may be present.   Final Clinical Impression(s) / ED Diagnoses Final diagnoses:  Hyponatremia  Hypokalemia  Partial intestinal obstruction, unspecified cause (HCC)  Diverticulitis of large intestine with abscess without bleeding    Rx / DC Orders ED Discharge Orders     None         Achille Rich, PA-C 10/12/23 [address], [place], [place] 10/13/23 [phone]

## 2023-10-13 ENCOUNTER — Ambulatory Visit: Admitting: Physician Assistant

## 2023-10-13 ENCOUNTER — Encounter (HOSPITAL_COMMUNITY): Payer: Self-pay | Admitting: Internal Medicine

## 2023-10-13 ENCOUNTER — Inpatient Hospital Stay (HOSPITAL_COMMUNITY)

## 2023-10-13 DIAGNOSIS — K5720 Diverticulitis of large intestine with perforation and abscess without bleeding: Secondary | ICD-10-CM | POA: Diagnosis not present

## 2023-10-13 LAB — BASIC METABOLIC PANEL WITH GFR
Anion gap: 15 (ref 5–15)
BUN: 7 mg/dL — ABNORMAL LOW (ref 8–23)
CO2: 18 mmol/L — ABNORMAL LOW (ref 22–32)
Calcium: 7.7 mg/dL — ABNORMAL LOW (ref 8.9–10.3)
Chloride: 94 mmol/L — ABNORMAL LOW (ref 98–111)
Creatinine, Ser: 0.41 mg/dL — ABNORMAL LOW (ref 0.44–1.00)
GFR, Estimated: >60 mL/min (ref >60)
Glucose, Bld: 83 mg/dL (ref 70–99)
Potassium: 3.1 mmol/L — ABNORMAL LOW (ref 3.5–5.1)
Sodium: 127 mmol/L — ABNORMAL LOW (ref 135–145)

## 2023-10-13 LAB — CBC
HCT: 29.1 % — ABNORMAL LOW (ref 36.0–46.0)
Hemoglobin: 9.8 g/dL — ABNORMAL LOW (ref 12.0–15.0)
MCH: 30.9 pg (ref 26.0–34.0)
MCHC: 33.7 g/dL (ref 30.0–36.0)
MCV: 91.8 fL (ref 80.0–100.0)
Platelets: 488 10*3/uL — ABNORMAL HIGH (ref 150–400)
RBC: 3.17 MIL/uL — ABNORMAL LOW (ref 3.87–5.11)
RDW: 14.1 % (ref 11.5–15.5)
WBC: 30.9 10*3/uL — ABNORMAL HIGH (ref 4.0–10.5)
nRBC: 0 % (ref 0.0–0.2)

## 2023-10-13 LAB — PROTIME-INR
INR: 1.3 — ABNORMAL HIGH (ref 0.8–1.2)
Prothrombin Time: 15.9 s — ABNORMAL HIGH (ref 11.4–15.2)

## 2023-10-13 MED ORDER — KETOROLAC TROMETHAMINE 15 MG/ML IJ SOLN
15.0000 mg | Freq: Four times a day (QID) | INTRAMUSCULAR | Status: DC | PRN
  Administered 2023-10-13 – 2023-10-14 (×3): 15 mg via INTRAVENOUS
  Filled 2023-10-13 (×3): qty 1

## 2023-10-13 MED ORDER — MIDAZOLAM HCL 2 MG/2ML IJ SOLN
INTRAMUSCULAR | Status: AC | PRN
  Administered 2023-10-13 (×2): 1 mg via INTRAVENOUS

## 2023-10-13 MED ORDER — FENTANYL CITRATE (PF) 100 MCG/2ML IJ SOLN
INTRAMUSCULAR | Status: AC
Start: 2023-10-13 — End: ?
  Filled 2023-10-13: qty 4

## 2023-10-13 MED ORDER — SODIUM CHLORIDE 0.9% FLUSH
5.0000 mL | Freq: Three times a day (TID) | INTRAVENOUS | Status: DC
Start: 2023-10-13 — End: 2023-10-19
  Administered 2023-10-13 – 2023-10-18 (×15): 5 mL

## 2023-10-13 MED ORDER — FENTANYL CITRATE (PF) 100 MCG/2ML IJ SOLN
INTRAMUSCULAR | Status: AC | PRN
  Administered 2023-10-13: 50 ug via INTRAVENOUS

## 2023-10-13 MED ORDER — POTASSIUM CHLORIDE 10 MEQ/100ML IV SOLN
10.0000 meq | INTRAVENOUS | Status: AC
  Administered 2023-10-13 (×4): 10 meq via INTRAVENOUS
  Filled 2023-10-13 (×4): qty 100

## 2023-10-13 MED ORDER — LIDOCAINE HCL 1 % IJ SOLN
INTRAMUSCULAR | Status: AC | PRN
  Administered 2023-10-13: 5 mL via INTRADERMAL

## 2023-10-13 MED ORDER — MIDAZOLAM HCL 2 MG/2ML IJ SOLN
INTRAMUSCULAR | Status: AC
  Filled 2023-10-13: qty 4

## 2023-10-13 MED ORDER — LACTATED RINGERS IV SOLN: INTRAVENOUS | Status: DC

## 2023-10-13 NOTE — Consult Note (Signed)
 Chief Complaint: Abdominal pain/abscesses; referred for image guided aspiration/drainage of pelvic abscess  Referring Provider(s): Thomas,A  Supervising Physician: Malachy Moan  Patient Status: [facility] - In-pt  History of Present Illness: Susan Fletcher is a 78 y.o. female ex smoker  no significant past medical history who was admitted to [facility] last night with persistent abdominal pain, occasional back pain, decreased appetite, occasional chills/diaphoresis.  CT abdomen pelvis revealed:  Severe sigmoid diverticulitis is noted with multiple pericolonic abscess is present. The largest measures 6.3 x 2.8 cm anteriorly in the pelvis. Another measures 3.6 x 3.1 cm posteriorly. Mild dilatation of more proximal colon is noted suggesting partial obstruction secondary to this inflammation.   Hepatic steatosis.   Mild cholelithiasis.  Aortic atherosclerosis    Current temp 99.3, BP 98/53, WBC 30.9, hemoglobin 9.8, platelets 488k, potassium 3.1, creatinine 0.41 ,PT 15.9, INR 1.3 ; blood culture negative to date, on IV Zosyn; patient seen by surgery and request now received for image guided drainage of pelvic abscess.        Patient is Full Code  Past Medical History:  Diagnosis Date   Headache     Past Surgical History:  Procedure Laterality Date   DILATION AND CURETTAGE, DIAGNOSTIC / THERAPEUTIC     ORIF WRIST FRACTURE Right 04/30/2018   Procedure: OPEN REDUCTION INTERNAL FIXATION (ORIF) RIGHT DISTAL RADIUS;  Surgeon: [name], MD;  Location: [facility];  Service: Orthopedics;  Laterality: Right;    Allergies: Patient has no known allergies.  Medications: Prior to Admission medications   Medication Sig Start Date End Date Taking? Authorizing Provider  aspirin-acetaminophen-caffeine (EXCEDRIN MIGRAINE) [phone] MG tablet Take 1 tablet by mouth every 6 (six) hours as needed for headache.   Yes [provider]  ibuprofen (ADVIL)  200 MG tablet Take 200 mg by mouth every 6 (six) hours as needed for mild pain (pain score 1-3).   Yes [provider]     Family History  Problem Relation Age of Onset   Healthy Mother     Social History   Socioeconomic History   Marital status: Divorced    Spouse name: Not on file   Number of children: Not on file   Years of education: Not on file   Highest education level: Not on file  Occupational History   Occupation: retired  Tobacco Use   Smoking status: Former    Types: Cigarettes   Smokeless tobacco: Never  Vaping Use   Vaping status: Never Used  Substance and Sexual Activity   Alcohol use: Yes    Comment: occasionally   Drug use: Never   Sexual activity: Not Currently  Other Topics Concern   Not on file  Social History Narrative   Lives alone with cat, friends will be able help   Social Drivers of Health   Financial Resource Strain: Not on file  Food Insecurity: No Food Insecurity (10/12/2023)   Hunger Vital Sign    Worried About Running Out of Food in the Last Year: Never true    Ran Out of Food in the Last Year: Never true  Transportation Needs: No Transportation Needs (10/12/2023)   PRAPARE - Administrator, Civil Service (Medical): No    Lack of Transportation (Non-Medical): No  Physical Activity: Not on file  Stress: Not on file  Social Connections: Moderately Integrated (10/12/2023)   Social Connection and Isolation Panel [NHANES]    Frequency of Communication with Friends and Family: Three  times a week    Frequency of Social Gatherings with Friends and Family: Three times a week    Attends Religious Services: More than 4 times per year    Active Member of Clubs or Organizations: No    Attends Engineer, structural: More than 4 times per year    Marital Status: Divorced       Review of Systems currently denies fever, headache, chest pain, dyspnea, cough, nausea, vomiting or visible bleeding  Vital Signs: BP (!)  98/53 (BP Location: Left Arm)   Pulse 95   Temp 99.3 F (37.4 C) (Oral)   Resp 18   Ht 5\' 1"  (1.549 m)   Wt 89 lb 9.6 oz (40.6 kg)   SpO2 95%   BMI 16.93 kg/m   Advance Care Plan: No documents on file      Physical Exam awake, alert. Thin, frail white female.  Chest clear to auscultation bilaterally.  Heart with regular rate and rhythm.  Abdomen soft, +bowel sounds, some mild generalized tenderness to palpation.  No lower extremity edema.  Imaging: CT ABDOMEN PELVIS W CONTRAST Result Date: 10/12/2023 CLINICAL DATA:  Acute generalized abdominal pain. EXAM: CT ABDOMEN AND PELVIS WITH CONTRAST TECHNIQUE: Multidetector CT imaging of the abdomen and pelvis was performed using the standard protocol following bolus administration of intravenous contrast. RADIATION DOSE REDUCTION: This exam was performed according to the departmental dose-optimization program which includes automated exposure control, adjustment of the mA and/or kV according to patient size and/or use of iterative reconstruction technique. CONTRAST:  80mL OMNIPAQUE IOHEXOL 300 MG/ML  SOLN COMPARISON:  None Available. FINDINGS: Lower chest: No acute abnormality. Hepatobiliary: Hepatic steatosis is noted. Mild cholelithiasis is noted. No biliary dilatation is noted. Pancreas: Unremarkable. No pancreatic ductal dilatation or surrounding inflammatory changes. Spleen: Normal in size without focal abnormality. Adrenals/Urinary Tract: Adrenal glands are unremarkable. Kidneys are normal, without renal calculi, focal lesion, or hydronephrosis. Bladder is unremarkable. Stomach/Bowel: The stomach is unremarkable. There is no evidence of small bowel dilatation. The appendix is not clearly visualized. There appears to be severe sigmoid diverticulitis present with multiple small pericolonic abscesses. The largest measures 6.3 x 2.8 cm best seen on image number 56 of series 8. Probable 3.6 x 3.1 cm abscess is noted posteriorly in the pelvis. There  appears to be mild dilatation of the more proximal colon suggesting partial obstruction secondary to this inflammation. Vascular/Lymphatic: Aortic atherosclerosis. No enlarged abdominal or pelvic lymph nodes. Reproductive: Intrauterine device is noted. Adnexal regions are not well visualized due to previously described inflammation in the pelvis. Other: No definite hernia is noted. Musculoskeletal: No acute or significant osseous findings. IMPRESSION: Severe sigmoid diverticulitis is noted with multiple pericolonic abscess is present. The largest measures 6.3 x 2.8 cm anteriorly in the pelvis. Another measures 3.6 x 3.1 cm posteriorly. Mild dilatation of more proximal colon is noted suggesting partial obstruction secondary to this inflammation. Hepatic steatosis. Mild cholelithiasis. Aortic Atherosclerosis (ICD10-I70.0). Electronically Signed   By: Lupita Raider M.D.   On: 10/12/2023 17:51    Labs:  CBC: Recent Labs    10/12/23 1351 10/13/23 0348  WBC 25.8* 30.9*  HGB 10.7* 9.8*  HCT 31.2* 29.1*  PLT 530* 488*    COAGS: Recent Labs    10/13/23 0828  INR 1.3*    BMP: Recent Labs    10/12/23 1351 10/13/23 0348  NA 128* 127*  K 3.1* 3.1*  CL 93* 94*  CO2 22 18*  GLUCOSE 102* 83  BUN 11 7*  CALCIUM 8.1* 7.7*  CREATININE 0.55 0.41*  GFRNONAA >60 >60    LIVER FUNCTION TESTS: Recent Labs    10/12/23 1351  BILITOT 0.7  AST 16  ALT 13  ALKPHOS 74  PROT 5.9*  ALBUMIN 2.3*    TUMOR MARKERS: No results for input(s): "AFPTM", "CEA", "CA199", "CHROMGRNA" in the last 8760 hours.  Assessment and Plan: 78 y.o. female ex smoker  no significant past medical history who was admitted to [facility] last night with persistent abdominal pain, occasional back pain, decreased appetite, occasional chills/diaphoresis.  CT abdomen pelvis revealed:  Severe sigmoid diverticulitis is noted with multiple pericolonic abscess is present. The largest measures 6.3 x 2.8 cm anteriorly  in the pelvis. Another measures 3.6 x 3.1 cm posteriorly. Mild dilatation of more proximal colon is noted suggesting partial obstruction secondary to this inflammation.   Hepatic steatosis.   Mild cholelithiasis.  Aortic atherosclerosis    Current temp 99.3, BP 98/53, WBC 30.9, hemoglobin 9.8, platelets 488k, potassium 3.1, creatinine 0.41 ,PT 15.9, INR 1.3 ; blood culture negative to date, on IV Zosyn; patient seen by surgery and request now received for image guided drainage of pelvic abscess.  Imaging studies have been reviewed by Dr. Archer Asa.Risks and benefits discussed with the patient including bleeding, infection, damage to adjacent structures, bowel perforation/fistula connection, and sepsis.  All of the patient's questions were answered, patient is agreeable to proceed. Consent signed and in chart.    Thank you for allowing our service to participate in Susan Fletcher 's care.  Electronically Signed: D. Jeananne Rama, PA-C   10/13/2023, 9:08 AM      I spent a total of 40 Minutes    in face to face in clinical consultation, greater than 50% of which was counseling/coordinating care for CT guided aspiration/drainage of pelvic abscess

## 2023-10-13 NOTE — Plan of Care (Signed)

## 2023-10-13 NOTE — Progress Notes (Signed)
 Subjective/Chief Complaint: Patient's pain is less today.   Objective: Vital signs in last 24 hours: Temp:  [98 F (36.7 C)-99.3 F (37.4 C)] 98.3 F (36.8 C) (03/28 0900) Pulse Rate:  [86-100] 96 (03/28 0900) Resp:  [16-18] 17 (03/28 0900) BP: (98-120)/(53-65) 108/59 (03/28 0900) SpO2:  [95 %-100 %] 96 % (03/28 0900) Weight:  [40.6 kg] 40.6 kg (03/27 1314) Last BM Date : 10/12/23  Intake/Output from previous day: 03/27 0701 - 03/28 0700 In: 2057 [I.V.:931.9; IV Piggyback:1125.1] Out: -  Intake/Output this shift: No intake/output data recorded.  Abdomen: Soft nontender without rebound or guarding.  Lab Results:  Recent Labs    10/12/23 1351 10/13/23 0348  WBC 25.8* 30.9*  HGB 10.7* 9.8*  HCT 31.2* 29.1*  PLT 530* 488*   BMET Recent Labs    10/12/23 1351 10/13/23 0348  NA 128* 127*  K 3.1* 3.1*  CL 93* 94*  CO2 22 18*  GLUCOSE 102* 83  BUN 11 7*  CREATININE 0.55 0.41*  CALCIUM 8.1* 7.7*   PT/INR Recent Labs    10/13/23 0828  LABPROT 15.9*  INR 1.3*   ABG No results for input(s): "PHART", "HCO3" in the last 72 hours.  Invalid input(s): "PCO2", "PO2"  Studies/Results: CT ABDOMEN PELVIS W CONTRAST Result Date: 10/12/2023 CLINICAL DATA:  Acute generalized abdominal pain. EXAM: CT ABDOMEN AND PELVIS WITH CONTRAST TECHNIQUE: Multidetector CT imaging of the abdomen and pelvis was performed using the standard protocol following bolus administration of intravenous contrast. RADIATION DOSE REDUCTION: This exam was performed according to the departmental dose-optimization program which includes automated exposure control, adjustment of the mA and/or kV according to patient size and/or use of iterative reconstruction technique. CONTRAST:  80mL OMNIPAQUE IOHEXOL 300 MG/ML  SOLN COMPARISON:  None Available. FINDINGS: Lower chest: No acute abnormality. Hepatobiliary: Hepatic steatosis is noted. Mild cholelithiasis is noted. No biliary dilatation is noted.  Pancreas: Unremarkable. No pancreatic ductal dilatation or surrounding inflammatory changes. Spleen: Normal in size without focal abnormality. Adrenals/Urinary Tract: Adrenal glands are unremarkable. Kidneys are normal, without renal calculi, focal lesion, or hydronephrosis. Bladder is unremarkable. Stomach/Bowel: The stomach is unremarkable. There is no evidence of small bowel dilatation. The appendix is not clearly visualized. There appears to be severe sigmoid diverticulitis present with multiple small pericolonic abscesses. The largest measures 6.3 x 2.8 cm best seen on image number 56 of series 8. Probable 3.6 x 3.1 cm abscess is noted posteriorly in the pelvis. There appears to be mild dilatation of the more proximal colon suggesting partial obstruction secondary to this inflammation. Vascular/Lymphatic: Aortic atherosclerosis. No enlarged abdominal or pelvic lymph nodes. Reproductive: Intrauterine device is noted. Adnexal regions are not well visualized due to previously described inflammation in the pelvis. Other: No definite hernia is noted. Musculoskeletal: No acute or significant osseous findings. IMPRESSION: Severe sigmoid diverticulitis is noted with multiple pericolonic abscess is present. The largest measures 6.3 x 2.8 cm anteriorly in the pelvis. Another measures 3.6 x 3.1 cm posteriorly. Mild dilatation of more proximal colon is noted suggesting partial obstruction secondary to this inflammation. Hepatic steatosis. Mild cholelithiasis. Aortic Atherosclerosis (ICD10-I70.0). Electronically Signed   By: Lupita Raider M.D.   On: 10/12/2023 17:51    Anti-infectives: Anti-infectives (From admission, onward)    Start     Dose/Rate Route Frequency Ordered Stop   10/13/23 0200  piperacillin-tazobactam (ZOSYN) IVPB 3.375 g        3.375 g 12.5 mL/hr over 240 Minutes Intravenous Every 8 hours 10/12/23 2040  10/12/23 1845  piperacillin-tazobactam (ZOSYN) IVPB 3.375 g        3.375 g 100 mL/hr  over 30 Minutes Intravenous  Once 10/12/23 1835 10/12/23 2042       Assessment/Plan: Diverticular abscess-IR to see for drain placement  Continue IV antibiotics  May advance diet after procedure  No acute need for surgical intervention.  Surgery to follow   LOS: 1 day    Dortha Schwalbe MD 10/13/2023 Moderate

## 2023-10-13 NOTE — Procedures (Signed)
 Interventional Radiology Procedure Note  Procedure: Placement of 43F drain via right transgluteal approach yielding 7 mL purulent material.   Complications: None  Estimated Blood Loss: None  Recommendations: - Drain to JP, convert to bag prior to DC - Flush TID    Signed,  Sterling Big, MD

## 2023-10-13 NOTE — Progress Notes (Signed)
 PROGRESS NOTE    Susan Fletcher  ZOX:[id number] DOB: Aug 03, 1945 DOA: 10/12/2023 PCP: Patient, No Pcp Per    Brief Narrative:   Susan Fletcher is a 78 y.o. female with  no known significant medical history who presented to the ED for evaluation of abdominal pain. Patient reports about 3 weeks ago she woke from sleep around midnight with severe lower abdominal pain.  Pain felt like "knives stabbing" her in the abdomen.  This intense pain lasted about 2 days before she had some improvement.  She did not seek medical attention at that time.  She says since then she has had on and off episodes of recurrent severe abdominal pain although she has had some constant baseline pain.   For the last couple days she developed severe pain again.  She has had associated chills and diaphoresis.  She reports low appetite and significant decreased oral intake.  She says she has not had any bowel movements for quite some time.  She says she is not passing flatus.   She has not had a previous colonoscopy.  She denies any prior abdominal surgeries.  She does not take any medications except for occasional Excedrin for headache.   In the ED, BP 120/65, pulse 98, RR 17, temp 98.2 F, SpO2 100% on room air.  WBC 25.8, hemoglobin 10.7, platelets 530,000, sodium 120, potassium 3.1, bicarb 22, BUN 11, creatinine 0.55, serum glucose 102, LFTs within normal limits, lipase 25.  Lactic acid 1.4. UA showed 80 ketones, negative nitrites, small leukocytes, 0-5 RBCs and WBCs, no bacteria.  Blood cultures in process. CT abdomen/pelvis with contrast showed severe sigmoid diverticulitis with multiple pericolonic abscesses.  Largest measures 6.3 x 2.8 cm anteriorly in the pelvis.  Another measured 3.6 x 3.1 cm posteriorly.  Mild dilatation of more proximal colon is noted suggesting partial obstruction secondary to this inflammation.  Hepatic steatosis and mild cholelithiasis also noted.   Patient was given 1 L LR, IV Zosyn, IV K 10 mEq x 4.   EDP spoke with general surgery (Dr. Maisie Fus) who recommended medical admission and will see in consultation today.  The hospitalist service was consulted to admit.  Assessment & Plan:   Severe sigmoid diverticulitis with multiple pericolonic abscesses: Patient presenting with 3-week history of abdominal pain with acute worsening.  CT abdomen/pelvis with severe sigmoid diverticulitis with multiple pericolonic abscesses largest measuring 6.3 x 2.8 cm.  Additionally there is concern for associated partial colonic obstruction due to the inflammation.   -- General Surgery following, appreciate assistance -- Interventional radiology consulted for drain placement -- WBC 25.8>30.9 -- Zosyn 3.375 g IV every 8 hours -- NPO; may start diet following procedure per general surgery -- Pain control, antiemetics, supportive care -- CBC daily  Hypokalemia: Potassium 3.1, replating through IV --BMP daily with magnesium   Hyponatremia: Mild and secondary to volume depletion.   -- Na 128>127 -- LR at 100 mL/h -- BMP daily   Normocytic anemia: Mild without obvious bleeding.   -- Hgb 10.7>9.8 -- CBC daily   DVT prophylaxis: SCDs Start: 10/12/23 1939    Code Status: Full Code Family Communication: No family present at bedside this morning  Disposition Plan:  Level of care: Med-Surg Status is: Inpatient Remains inpatient appropriate because: IV antibiotics, pending IR drain placement    Consultants:  General surgery Interventional radiology  Procedures:  Pending CT-guided drain placement by IR today  Antimicrobials:  Zosyn 3/27>>   Subjective: Patient seen examined bedside, lying in bed.  Abdominal pain improved.  Seen by general surgery, consulted IR for drain placement regarding her intra-abdominal abscesses.  Remains on IV antibiotics.  No other specific complaints, questions or concerns at this time.  Denies headache, no visual changes, no chest pain, no palpitations, no  fever/chills/night sweats, no nausea/vomiting/diarrhea, no focal weakness, no fatigue, no paresthesia.  No acute events overnight per nurse staff.  Objective: Vitals:   10/12/23 2103 10/13/23 0132 10/13/23 0500 10/13/23 0900  BP: 111/65 (!) 117/59 (!) 98/53 (!) 108/59  Pulse: 91 100 95 96  Resp: 16 18 18 17   Temp: 98.7 F (37.1 C) 98.2 F (36.8 C) 99.3 F (37.4 C) 98.3 F (36.8 C)  TempSrc: Oral  Oral Oral  SpO2: 97% 95% 95% 96%  Weight:      Height:        Intake/Output Summary (Last 24 hours) at 10/13/2023 1108 Last data filed at 10/13/2023 0656 Gross per 24 hour  Intake 2057 ml  Output --  Net 2057 ml   Filed Weights   10/12/23 1314  Weight: 40.6 kg    Examination:  Physical Exam: GEN: NAD, alert and oriented x 3, thin/elderly in appearance HEENT: NCAT, PERRL, EOMI, sclera clear, MMM PULM: CTAB w/o wheezes/crackles, normal respiratory effort, on room air CV: RRR w/o M/G/R GI: abd soft, NTND, + BS MSK: no peripheral edema, muscle strength globally intact 5/5 bilateral upper/lower extremities NEURO: CN II-XII intact, no focal deficits, sensation to light touch intact PSYCH: normal mood/affect Integumentary: dry/intact, no rashes or wounds    Data Reviewed: I have personally reviewed following labs and imaging studies  CBC: Recent Labs  Lab 10/12/23 1351 10/13/23 0348  WBC 25.8* 30.9*  HGB 10.7* 9.8*  HCT 31.2* 29.1*  MCV 92.9 91.8  PLT 530* 488*   Basic Metabolic Panel: Recent Labs  Lab 10/12/23 1351 10/13/23 0348  NA 128* 127*  K 3.1* 3.1*  CL 93* 94*  CO2 22 18*  GLUCOSE 102* 83  BUN 11 7*  CREATININE 0.55 0.41*  CALCIUM 8.1* 7.7*   GFR: Estimated Creatinine Clearance: 37.1 mL/min (A) (by C-G formula based on SCr of 0.41 mg/dL (L)). Liver Function Tests: Recent Labs  Lab 10/12/23 1351  AST 16  ALT 13  ALKPHOS 74  BILITOT 0.7  PROT 5.9*  ALBUMIN 2.3*   Recent Labs  Lab 10/12/23 1351  LIPASE 25   No results for input(s):  "AMMONIA" in the last 168 hours. Coagulation Profile: Recent Labs  Lab 10/13/23 0828  INR 1.3*   Cardiac Enzymes: No results for input(s): "CKTOTAL", "CKMB", "CKMBINDEX", "TROPONINI" in the last 168 hours. BNP (last 3 results) No results for input(s): "PROBNP" in the last 8760 hours. HbA1C: No results for input(s): "HGBA1C" in the last 72 hours. CBG: No results for input(s): "GLUCAP" in the last 168 hours. Lipid Profile: No results for input(s): "CHOL", "HDL", "LDLCALC", "TRIG", "CHOLHDL", "LDLDIRECT" in the last 72 hours. Thyroid Function Tests: No results for input(s): "TSH", "T4TOTAL", "FREET4", "T3FREE", "THYROIDAB" in the last 72 hours. Anemia Panel: No results for input(s): "VITAMINB12", "FOLATE", "FERRITIN", "TIBC", "IRON", "RETICCTPCT" in the last 72 hours. Sepsis Labs: Recent Labs  Lab 10/12/23 1643 10/12/23 2107  LATICACIDVEN 1.4 0.9    Recent Results (from the past 240 hours)  Blood culture (routine x 2)     Status: None (Preliminary result)   Collection Time: 10/12/23  4:40 PM   Specimen: BLOOD  Result Value Ref Range Status   Specimen Description   Final  BLOOD SITE NOT SPECIFIED Performed at [facility], [address] [place], [place] [address]    Special Requests   Final    BOTTLES DRAWN AEROBIC AND ANAEROBIC Blood Culture results may not be optimal due to an inadequate volume of blood received in culture bottles Performed at [facility], [address] [place], [place] [address]    Culture   Final    NO GROWTH < 12 HOURS Performed at [facility], [address] [place], [place] [address]    Report Status PENDING  Incomplete  Blood culture (routine x 2)     Status: None (Preliminary result)   Collection Time: 10/12/23  4:43 PM   Specimen: BLOOD  Result Value Ref Range Status   Specimen Description   Final    BLOOD SITE NOT SPECIFIED Performed at [facility], [address] [place], [place] [address]    Special Requests   Final    BOTTLES DRAWN AEROBIC AND ANAEROBIC Blood Culture results may not be optimal due to an inadequate volume of blood received in culture bottles Performed at [facility], [address] [place], [place] [address]    Culture   Final    NO GROWTH < 12 HOURS Performed at [facility], [address], [place], [place] [address]    Report Status PENDING  Incomplete         Radiology Studies: CT ABDOMEN PELVIS W CONTRAST Result Date: 10/12/2023 CLINICAL DATA:  Acute generalized abdominal pain. EXAM: CT ABDOMEN AND PELVIS WITH CONTRAST TECHNIQUE: Multidetector CT imaging of the abdomen and pelvis was performed using the standard protocol following bolus administration of intravenous contrast. RADIATION DOSE REDUCTION: This exam was performed according to the departmental dose-optimization program which includes automated exposure control, adjustment of the mA and/or kV according to patient size and/or use of iterative reconstruction technique. CONTRAST:  80mL OMNIPAQUE IOHEXOL 300 MG/ML  SOLN COMPARISON:  None Available. FINDINGS: Lower chest: No acute abnormality. Hepatobiliary: Hepatic steatosis is noted. Mild cholelithiasis is noted. No biliary dilatation is noted. Pancreas: Unremarkable. No pancreatic ductal dilatation or surrounding inflammatory changes. Spleen: Normal in size without focal abnormality. Adrenals/Urinary Tract: Adrenal glands are unremarkable. Kidneys are normal, without renal calculi, focal lesion, or hydronephrosis. Bladder is unremarkable. Stomach/Bowel: The stomach is unremarkable. There is no evidence of small bowel dilatation. The appendix is not clearly visualized. There appears to be severe sigmoid diverticulitis present with multiple small pericolonic abscesses. The largest measures 6.3 x 2.8 cm best seen on image number 56 of series 8. Probable 3.6 x 3.1 cm abscess is noted posteriorly in the  pelvis. There appears to be mild dilatation of the more proximal colon suggesting partial obstruction secondary to this inflammation. Vascular/Lymphatic: Aortic atherosclerosis. No enlarged abdominal or pelvic lymph nodes. Reproductive: Intrauterine device is noted. Adnexal regions are not well visualized due to previously described inflammation in the pelvis. Other: No definite hernia is noted. Musculoskeletal: No acute or significant osseous findings. IMPRESSION: Severe sigmoid diverticulitis is noted with multiple pericolonic abscess is present. The largest measures 6.3 x 2.8 cm anteriorly in the pelvis. Another measures 3.6 x 3.1 cm posteriorly. Mild dilatation of more proximal colon is noted suggesting partial obstruction secondary to this inflammation. Hepatic steatosis. Mild cholelithiasis. Aortic Atherosclerosis (ICD10-I70.0). Electronically Signed   By: Lupita Raider M.D.   On: 10/12/2023 17:51        Scheduled Meds: Continuous Infusions:  lactated  ringers 100 mL/hr at 10/13/23 0744   piperacillin-tazobactam (ZOSYN)  IV Stopped (10/13/23 0981)   potassium chloride 10 mEq (10/13/23 1031)     LOS: 1 day    Time spent: 52 minutes spent on 10/13/2023 caring for this patient face-to-face including chart review, ordering labs/tests, documenting, discussion with nursing staff, consultants, updating family and interview/physical exam    Alvira Philips [place], DO [facility] Available via Epic secure chat 7am-7pm After these hours, please refer to coverage provider listed on [email or web address] 10/13/2023, 11:08 AM

## 2023-10-13 NOTE — TOC Initial Note (Signed)
 Transition of Care [facility]) - Initial/Assessment Note    Patient Details  Name: Susan Fletcher MRN: [id number] Date of Birth: [birth year]  [facility]) CM/SW Contact:    Jessie Foot, RN Phone Number: 10/13/2023, 4:27 PM  Clinical Narrative:                 Spoke to pt in room; pt says she lives at home alone; pt says se plans to return home at d/c; she will  drive self at discharge. she verified insurance; she denies SDOH risks; pt says she does not have DME, HH services, or home oxygen; Patient needs a PCP and an appointment. Patient states she did not remember her telephone number and cell phone was at nurses station charging. Patient states has no family. Will give a friends number for emergency only when she has her phone again. Patient plans on driving self home at discharge. TOC will follow for telephone numbers for patient and friend.  Expected Discharge Plan: Home/Self Care Barriers to Discharge: Continued Medical Work up   Patient Goals and CMS Choice Patient states their goals for this hospitalization and ongoing recovery are:: Return home          Expected Discharge Plan and Services In-house Referral: NA Discharge Planning Services: CM Consult   Living arrangements for the past 2 months: Single Family Home                                      Prior Living Arrangements/Services Living arrangements for the past 2 months: Single Family Home Lives with:: Self Patient language and need for interpreter reviewed:: Yes Do you feel safe going back to the place where you live?: Yes      Need for Family Participation in Patient Care: No (Comment) Care giver support system in place?: No (comment) (Staes does not have any family)   Criminal Activity/Legal Involvement Pertinent to Current Situation/Hospitalization: No - Comment as needed  Activities of Daily Living   ADL Screening (condition at time of admission) Independently performs ADLs?: Yes  (appropriate for developmental age) Is the patient deaf or have difficulty hearing?: No Does the patient have difficulty seeing, even when wearing glasses/contacts?: No Does the patient have difficulty concentrating, remembering, or making decisions?: No  Permission Sought/Granted Permission sought to share information with : Case Manager Permission granted to share information with : Yes, Verbal Permission Granted              Emotional Assessment Appearance:: Appears stated age Attitude/Demeanor/Rapport: Engaged Affect (typically observed): Accepting, Appropriate Orientation: : Oriented to Self, Oriented to Place, Oriented to  Time, Oriented to Situation Alcohol / Substance Use: Not Applicable Psych Involvement: No (comment)  Admission diagnosis:  Hypokalemia [E87.6] Hyponatremia [E87.1] Diverticulitis of large intestine with abscess without bleeding [K57.20] Abscess of sigmoid colon due to diverticulitis [K57.20] Partial intestinal obstruction, unspecified cause (HCC) [K56.600] Patient Active Problem List   Diagnosis Date Noted   Abscess of sigmoid colon due to diverticulitis 10/12/2023   Hypokalemia 10/12/2023   Hyponatremia 10/12/2023   Normocytic anemia 10/12/2023   PCP:  Patient, No Pcp Per Pharmacy:   [id number] [place], [place] - [address] AT [facility] OF [facility] [address] [place] [address] Phone: [phone] Fax: [phone]     Social Drivers of Health (SDOH) Social History: SDOH Screenings  Food Insecurity: No Food Insecurity (10/12/2023)  Housing: Unknown (10/12/2023)  Transportation Needs: No Transportation Needs (10/12/2023)  Utilities: Not At Risk (10/12/2023)  Depression (PHQ2-9): Low Risk  (11/04/2021)  Social Connections: Moderately Integrated (10/12/2023)  Tobacco Use: Medium Risk (10/12/2023)   SDOH Interventions:     Readmission Risk Interventions     No data to display

## 2023-10-14 DIAGNOSIS — K5720 Diverticulitis of large intestine with perforation and abscess without bleeding: Secondary | ICD-10-CM | POA: Diagnosis not present

## 2023-10-14 LAB — BASIC METABOLIC PANEL WITH GFR
Anion gap: 14 (ref 5–15)
BUN: 12 mg/dL (ref 8–23)
CO2: 20 mmol/L — ABNORMAL LOW (ref 22–32)
Calcium: 7.8 mg/dL — ABNORMAL LOW (ref 8.9–10.3)
Chloride: 98 mmol/L (ref 98–111)
Creatinine, Ser: 0.5 mg/dL (ref 0.44–1.00)
GFR, Estimated: >60 mL/min (ref >60)
Glucose, Bld: 99 mg/dL (ref 70–99)
Potassium: 3.4 mmol/L — ABNORMAL LOW (ref 3.5–5.1)
Sodium: 132 mmol/L — ABNORMAL LOW (ref 135–145)

## 2023-10-14 LAB — CBC
HCT: 28.3 % — ABNORMAL LOW (ref 36.0–46.0)
Hemoglobin: 9.4 g/dL — ABNORMAL LOW (ref 12.0–15.0)
MCH: 31.4 pg (ref 26.0–34.0)
MCHC: 33.2 g/dL (ref 30.0–36.0)
MCV: 94.6 fL (ref 80.0–100.0)
Platelets: 487 K/uL — ABNORMAL HIGH (ref 150–400)
RBC: 2.99 MIL/uL — ABNORMAL LOW (ref 3.87–5.11)
RDW: 14.2 % (ref 11.5–15.5)
WBC: 23.5 K/uL — ABNORMAL HIGH (ref 4.0–10.5)
nRBC: 0 % (ref 0.0–0.2)

## 2023-10-14 LAB — PHOSPHORUS: Phosphorus: 2.2 mg/dL — ABNORMAL LOW (ref 2.5–4.6)

## 2023-10-14 LAB — MAGNESIUM: Magnesium: 1.7 mg/dL (ref 1.7–2.4)

## 2023-10-14 MED ORDER — MAGNESIUM SULFATE 2 GM/50ML IV SOLN
2.0000 g | Freq: Once | INTRAVENOUS | Status: AC
  Administered 2023-10-14: 2 g via INTRAVENOUS
  Filled 2023-10-14: qty 50

## 2023-10-14 MED ORDER — POTASSIUM CHLORIDE CRYS ER 20 MEQ PO TBCR
30.0000 meq | EXTENDED_RELEASE_TABLET | ORAL | Status: AC
  Administered 2023-10-14 (×2): 30 meq via ORAL
  Filled 2023-10-14 (×2): qty 1

## 2023-10-14 MED ORDER — SODIUM PHOSPHATES 45 MMOLE/15ML IV SOLN
15.0000 mmol | Freq: Once | INTRAVENOUS | Status: AC
  Administered 2023-10-14: 15 mmol via INTRAVENOUS
  Filled 2023-10-14: qty 5

## 2023-10-14 NOTE — Progress Notes (Signed)
 Subjective/Chief Complaint: Drain in place no abdominal pian    Objective: Vital signs in last 24 hours: Temp:  [97.9 F (36.6 C)-99.2 F (37.3 C)] 99.2 F (37.3 C) (03/29 0531) Pulse Rate:  [79-100] 100 (03/29 0531) Resp:  [17-24] 18 (03/29 0531) BP: (92-108)/(54-64) 106/61 (03/29 0531) SpO2:  [95 %-100 %] 95 % (03/29 0531) Last BM Date : 10/12/23  Intake/Output from previous day: 03/28 0701 - 03/29 0700 In: 1113.6 [I.V.:671.3; IV Piggyback:427.3] Out: 10 [Drains:10] Intake/Output this shift: No intake/output data recorded.  Abd: soft NT ND drain minimal output dark   Lab Results:  Recent Labs    10/13/23 0348 10/14/23 0511  WBC 30.9* 23.5*  HGB 9.8* 9.4*  HCT 29.1* 28.3*  PLT 488* 487*   BMET Recent Labs    10/13/23 0348 10/14/23 0511  NA 127* 132*  K 3.1* 3.4*  CL 94* 98  CO2 18* 20*  GLUCOSE 83 99  BUN 7* 12  CREATININE 0.41* 0.50  CALCIUM 7.7* 7.8*   PT/INR Recent Labs    10/13/23 0828  LABPROT 15.9*  INR 1.3*   ABG No results for input(s): "PHART", "HCO3" in the last 72 hours.  Invalid input(s): "PCO2", "PO2"  Studies/Results: CT GUIDED PERITONEAL/RETROPERITONEAL FLUID DRAIN BY PERC CATH Result Date: 10/13/2023 INDICATION: 78 year old female with perforated diverticulitis and perirectal abscess. EXAM: CT-guided drain placement TECHNIQUE: Multidetector CT imaging of the low abdomen/pelvis was performed following the standard protocol without IV contrast. RADIATION DOSE REDUCTION: This exam was performed according to the departmental dose-optimization program which includes automated exposure control, adjustment of the mA and/or kV according to patient size and/or use of iterative reconstruction technique. MEDICATIONS: The patient is currently admitted to the hospital and receiving intravenous antibiotics. The antibiotics were administered within an appropriate time frame prior to the initiation of the procedure. ANESTHESIA/SEDATION: Moderate  (conscious) sedation was employed during this procedure. A total of Versed 2 mg and Fentanyl 50 mcg was administered intravenously by the radiology nurse. Total intra-service moderate Sedation Time: 15 minutes. The patient's level of consciousness and vital signs were monitored continuously by radiology nursing throughout the procedure under my direct supervision. COMPLICATIONS: None immediate. PROCEDURE: Informed written consent was obtained from the patient after a thorough discussion of the procedural risks, benefits and alternatives. All questions were addressed. Maximal Sterile Barrier Technique was utilized including caps, mask, sterile gowns, sterile gloves, sterile drape, hand hygiene and skin antiseptic. A timeout was performed prior to the initiation of the procedure. Axial CT imaging was performed. The small abscess collection anterior to the sacrum and just superior to the rectum was successfully localized. A suitable skin entry site was selected and marked. Local anesthesia was attained by infiltration with 1% lidocaine. A small dermatotomy was made. Utilizing intermittent CT guidance, an 18 gauge trocar needle was carefully advanced along a right parasacral approach into the fluid collection. A 0.035 wire was then coiled in the fluid collection. Percutaneous tract was dilated to 10 [place]. A skater 10 [place] all-purpose drainage catheter was advanced over the wire and formed. Aspiration yields 7 mL of frankly purulent fluid. Samples were sent for Gram stain and culture. The drainage catheter was gently flushed, connected to JP bulb suction and then secured to the skin with 0 Prolene suture. Follow-up CT imaging demonstrates a well-positioned drainage catheter with no evidence of complication. IMPRESSION: Successful placement of 10 French drainage catheter via a right transgluteal approach into the perirectal abscess. Electronically Signed   By: Malachy Moan  M.D.   On: 10/13/2023 14:30   CT  ABDOMEN PELVIS W CONTRAST Result Date: 10/12/2023 CLINICAL DATA:  Acute generalized abdominal pain. EXAM: CT ABDOMEN AND PELVIS WITH CONTRAST TECHNIQUE: Multidetector CT imaging of the abdomen and pelvis was performed using the standard protocol following bolus administration of intravenous contrast. RADIATION DOSE REDUCTION: This exam was performed according to the departmental dose-optimization program which includes automated exposure control, adjustment of the mA and/or kV according to patient size and/or use of iterative reconstruction technique. CONTRAST:  80mL OMNIPAQUE IOHEXOL 300 MG/ML  SOLN COMPARISON:  None Available. FINDINGS: Lower chest: No acute abnormality. Hepatobiliary: Hepatic steatosis is noted. Mild cholelithiasis is noted. No biliary dilatation is noted. Pancreas: Unremarkable. No pancreatic ductal dilatation or surrounding inflammatory changes. Spleen: Normal in size without focal abnormality. Adrenals/Urinary Tract: Adrenal glands are unremarkable. Kidneys are normal, without renal calculi, focal lesion, or hydronephrosis. Bladder is unremarkable. Stomach/Bowel: The stomach is unremarkable. There is no evidence of small bowel dilatation. The appendix is not clearly visualized. There appears to be severe sigmoid diverticulitis present with multiple small pericolonic abscesses. The largest measures 6.3 x 2.8 cm best seen on image number 56 of series 8. Probable 3.6 x 3.1 cm abscess is noted posteriorly in the pelvis. There appears to be mild dilatation of the more proximal colon suggesting partial obstruction secondary to this inflammation. Vascular/Lymphatic: Aortic atherosclerosis. No enlarged abdominal or pelvic lymph nodes. Reproductive: Intrauterine device is noted. Adnexal regions are not well visualized due to previously described inflammation in the pelvis. Other: No definite hernia is noted. Musculoskeletal: No acute or significant osseous findings. IMPRESSION: Severe sigmoid  diverticulitis is noted with multiple pericolonic abscess is present. The largest measures 6.3 x 2.8 cm anteriorly in the pelvis. Another measures 3.6 x 3.1 cm posteriorly. Mild dilatation of more proximal colon is noted suggesting partial obstruction secondary to this inflammation. Hepatic steatosis. Mild cholelithiasis. Aortic Atherosclerosis (ICD10-I70.0). Electronically Signed   By: Lupita Raider M.D.   On: 10/12/2023 17:51    Anti-infectives: Anti-infectives (From admission, onward)    Start     Dose/Rate Route Frequency Ordered Stop   10/13/23 0200  piperacillin-tazobactam (ZOSYN) IVPB 3.375 g        3.375 g 12.5 mL/hr over 240 Minutes Intravenous Every 8 hours 10/12/23 2040     10/12/23 1845  piperacillin-tazobactam (ZOSYN) IVPB 3.375 g        3.375 g 100 mL/hr over 30 Minutes Intravenous  Once 10/12/23 1835 10/12/23 2042       Assessment/Plan: Diverticular abscess-IR  drain placement  Can adv  diet Can transition to po abx  Consider D/C over next 24 - 48 hours if WBC continue to decline  No acute surgical need at this point   LOS: 2 days    Dortha Schwalbe MD  10/14/2023 LOW

## 2023-10-14 NOTE — Plan of Care (Signed)

## 2023-10-14 NOTE — Discharge Instructions (Signed)
 Interventional Radiology Percutaneous Abscess Drain Placement After Care   This sheet gives you information about how to care for yourself after your procedure. Your health care provider may also give you more specific instructions. Your drain was placed by an interventional radiologist with [facility]. If you have questions or concerns, contact [facility] at [phone].   What is a percutaneous drain?   A drain is a small plastic tube (catheter) that goes into the fluid collection in your body through your skin.   How long will I need the drain?   How long the drain needs to stay in is determined by where the drain is, how much comes out of the drain each day and if you are having any other surgical procedures.   Interventional radiology will determine when it is time to remove the drain. It is important to follow up as directed so that the drain can be removed as soon as it is safe to do so.   What can I expect after the procedure?   After the procedure, it is common to have:   A small amount of bruising and discomfort in the area where the drainage tube (catheter) was placed.   Sleepiness and fatigue. This should go away after the medicines you were given have worn off.   Follow these instructions at home:   Insertion site care   Check your insertion site when you change the bandage. Check for:   More redness, swelling, or pain.   More fluid or blood.   Warmth.   Pus or a bad smell.   When caring for your insertion site:   Wash your hands with soap and water for at least 20 seconds before and after you change your bandage (dressing). If soap and water are not available, use hand sanitizer.   You do not need to change your dressing everyday if it is clean and dry. Change your dressing every 3 days or as needed when it is soiled, wet or becoming dislodged. You will need to change your dressing each time you shower.   Leave stitches (sutures), skin  glue, or adhesive strips in place. These skin closures may need to stay in place for 2 weeks or longer. If adhesive strip edges start to loosen and curl up, you may trim the loose edges. Do not remove adhesive strips completely unless your health care provider tells you to do so.   Catheter care   Flush the catheter once per day with 5 mL of 0.9% normal saline unless you are told otherwise by your healthcare provider. This helps to prevent clogs in the catheter.   To disconnect the drain, turn the clear plastic tube to the left. Attach the saline syringe by placing it on the white end of the drain and turning gently to the right. Once attached gently push the plunger to the 5 mL mark. After you are done flushing, disconnect the syringe by turning to the left and reattach your drainage container   If you have a bulb please be sure the bulb is charged after reconnecting it - to do this pinch the bulb between your thumb and first finger and close the stopper located on the top of the bulb.    Check for fluid leaking from around your catheter (instead of fluid draining through your catheter). This may be a sign that the drain is no longer working correctly.   Write down the following information every time you empty your  bag:   The date and time.   The amount of drainage.   Activity   Rest at home for 1-2 days after your procedure.   For the first 48 hours do not lift anything more than 10 lbs (about a gallon of milk). You may perform moderate activities/exercise. Please avoid strenuous activities during this time.   Avoid any activities which may pull on your drain as this can cause your drain to become dislodged.   If you were given a sedative during the procedure, it can affect you for several hours. Do not drive or operate machinery until your health care provider says that it is safe.   General instructions   For mild pain take over-the-counter medications as needed for pain such as  Tylenol or Advil. If you are experiencing severe pain please call our office as this may indicate an issue with your drain.    If you were prescribed an antibiotic medicine, take it as told by your health care provider. Do not stop using the antibiotic even if you start to feel better.   You may shower 24 hours after the drain is placed. To do this cover the insertion site with a water tight material such as saran wrap and seal the edges with tape, you may also purchase waterproof dressings at your local drug store. Shower as usual and then remove the water tight dressing and any gauze/tape underneath it once you have exited the shower and dried off. Allow the area to air dry or pat dry with a clean towel. Once the skin is completely dry place a new gauze dressing. It is important to keep the site dry at all times to prevent infection.   Do not submerge the drain - this means you cannot take baths, swim, use a hot tub, etc. until the drain is removed.    Do not use any products that contain nicotine or tobacco, such as cigarettes, e-cigarettes, and chewing tobacco. If you need help quitting, ask your health care provider.   Keep all follow-up visits as told by your health care provider. This is important.   Contact a health care provider if:   You have less than 10 mL of drainage a day for 2-3 days in a row, or as directed by your health care provider.   You have any of these signs of infection:   More redness, swelling, or pain around your incision area.   More fluid or blood coming from your incision area.   Warmth coming from your incision area.   Pus or a bad smell coming from your incision area.   You have fluid leaking from around your catheter (instead of through your catheter).   You are unable to flush the drain.   You have a fever or chills.   You have pain that does not get better with medicine.   You have not been contacted to schedule a drain follow up appointment  within 10 days of discharge from the hospital.   Please call [facility] at [phone] with any questions or concerns.   Get help right away if:   Your catheter comes out.   You suddenly stop having drainage from your catheter.   You suddenly have blood in the fluid that is draining from your catheter.   You become dizzy or you faint.   You develop a rash.   You have nausea or vomiting.   You have difficulty breathing or you feel short  of breath.   You develop chest pain.   You have problems with your speech or vision.   You have trouble balancing or moving your arms or legs.   Summary   It is common to have a small amount of bruising and discomfort in the area where the drainage tube (catheter) was placed. You may also have minor discomfort with movement while the drain is in place.   Flush the drain once per day with 5 mL of 0.9% normal saline (unless you were told otherwise by your healthcare provider).    Record the amount of drainage from the bag every time you empty it.   Change the dressing every 3 days or earlier if soiled/wet. Keep the skin dry under the dressing.   You may shower with the drain in place. Do not submerge the drain (no baths, swimming, hot tubs, etc.).   Contact [facility] at [phone] if you have more redness, swelling, or pain around your incision area or if you have pain that does not get better with medicine.   This information is not intended to replace advice given to you by your health care provider. Make sure you discuss any questions you have with your health care provider.   Document Revised: 10/07/2021 Document Reviewed: 06/29/2019   [employer]vier Patient Education  2023 [employer]         Interventional Radiology Drain Record   Empty your drain at least once per day. You may empty it as often as needed. Use this form to write down the amount of fluid that has collected in the drainage container. Bring  this form with you to your follow-up visits. Please call [facility] at [phone] with any questions or concerns prior to your appointment.   Drain #1 location: ___________________   Date __________ Time __________ Amount __________   Date __________ Time __________ Amount __________   Date __________ Time __________ Amount __________   Date __________ Time __________ Amount __________   Date __________ Time __________ Amount __________   Date __________ Time __________ Amount __________   Date __________ Time __________ Amount __________   Date __________ Time __________ Amount __________   Date __________ Time __________ Amount __________   Date __________ Time __________ Amount __________   Date __________ Time __________ Amount __________   Date __________ Time __________ Amount __________   Date __________ Time __________ Amount __________   Date __________ Time __________ Amount __________

## 2023-10-14 NOTE — Progress Notes (Signed)
 Referring Physician(s): Romie Levee, MD  Supervising Physician: Richarda Overlie  Patient Status:  [facility] - In-pt  Chief Complaint: Diverticular abscess s/p right TG drain placement 10/13/23 in IR (Dr. Archer Asa)  Subjective:  Patient resting in bed this morning, very happy with how well she is feeling and the treatment she has received from everyone during this hospital stay. Having BMs and tolerating PO intake without issue. Bulb emptied earlier today, not sure if was flushed - thinks maybe was flushed last night. Drain not really sore or bothersome. She is hoping to be able to go home tomorrow.  Allergies: Patient has no known allergies.  Medications: Prior to Admission medications   Medication Sig Start Date End Date Taking? Authorizing Provider  aspirin-acetaminophen-caffeine (EXCEDRIN MIGRAINE) [id number] MG tablet Take 1 tablet by mouth every 6 (six) hours as needed for headache.   Yes [provider]  ibuprofen (ADVIL) 200 MG tablet Take 200 mg by mouth every 6 (six) hours as needed for mild pain (pain score 1-3).   Yes [provider]     Vital Signs: BP 106/61 (BP Location: Left Arm)   Pulse 100   Temp 99.2 F (37.3 C) (Oral)   Resp 18   Ht 5\' 1"  (1.549 m)   Wt 89 lb 9.6 oz (40.6 kg)   SpO2 95%   BMI 16.93 kg/m   Physical Exam Vitals and nursing note reviewed.  Constitutional:      General: She is not in acute distress. HENT:     Head: Normocephalic.  Cardiovascular:     Rate and Rhythm: Normal rate.  Pulmonary:     Effort: Pulmonary effort is normal.  Abdominal:     Comments: (+) right TG drain to suction bulb with scant thick bloody output on initial exam, bulb appears to have lost charge. Drain flushed with small amount of resistance, aspirated easily, after reconnecting and charging bulb return of ~10 cc bloody fluid. Insertion site clean, dry, dressed appropriately.   Skin:    General: Skin is warm and dry.  Neurological:     Mental  Status: She is alert. Mental status is at baseline.  Psychiatric:        Mood and Affect: Mood normal.        Behavior: Behavior normal.        Thought Content: Thought content normal.        Judgment: Judgment normal.     Imaging: CT GUIDED PERITONEAL/RETROPERITONEAL FLUID DRAIN BY PERC CATH Result Date: 10/13/2023 INDICATION: 78 year old female with perforated diverticulitis and perirectal abscess. EXAM: CT-guided drain placement TECHNIQUE: Multidetector CT imaging of the low abdomen/pelvis was performed following the standard protocol without IV contrast. RADIATION DOSE REDUCTION: This exam was performed according to the departmental dose-optimization program which includes automated exposure control, adjustment of the mA and/or kV according to patient size and/or use of iterative reconstruction technique. MEDICATIONS: The patient is currently admitted to the hospital and receiving intravenous antibiotics. The antibiotics were administered within an appropriate time frame prior to the initiation of the procedure. ANESTHESIA/SEDATION: Moderate (conscious) sedation was employed during this procedure. A total of Versed 2 mg and Fentanyl 50 mcg was administered intravenously by the radiology nurse. Total intra-service moderate Sedation Time: 15 minutes. The patient's level of consciousness and vital signs were monitored continuously by radiology nursing throughout the procedure under my direct supervision. COMPLICATIONS: None immediate. PROCEDURE: Informed written consent was obtained from the patient after a thorough discussion of the procedural risks, benefits  and alternatives. All questions were addressed. Maximal Sterile Barrier Technique was utilized including caps, mask, sterile gowns, sterile gloves, sterile drape, hand hygiene and skin antiseptic. A timeout was performed prior to the initiation of the procedure. Axial CT imaging was performed. The small abscess collection anterior to the sacrum and  just superior to the rectum was successfully localized. A suitable skin entry site was selected and marked. Local anesthesia was attained by infiltration with 1% lidocaine. A small dermatotomy was made. Utilizing intermittent CT guidance, an 18 gauge trocar needle was carefully advanced along a right parasacral approach into the fluid collection. A 0.035 wire was then coiled in the fluid collection. Percutaneous tract was dilated to 10 [place]. A skater 10 [place] all-purpose drainage catheter was advanced over the wire and formed. Aspiration yields 7 mL of frankly purulent fluid. Samples were sent for Gram stain and culture. The drainage catheter was gently flushed, connected to JP bulb suction and then secured to the skin with 0 Prolene suture. Follow-up CT imaging demonstrates a well-positioned drainage catheter with no evidence of complication. IMPRESSION: Successful placement of 10 French drainage catheter via a right transgluteal approach into the perirectal abscess. Electronically Signed   By: Malachy Moan M.D.   On: 10/13/2023 14:30   CT ABDOMEN PELVIS W CONTRAST Result Date: 10/12/2023 CLINICAL DATA:  Acute generalized abdominal pain. EXAM: CT ABDOMEN AND PELVIS WITH CONTRAST TECHNIQUE: Multidetector CT imaging of the abdomen and pelvis was performed using the standard protocol following bolus administration of intravenous contrast. RADIATION DOSE REDUCTION: This exam was performed according to the departmental dose-optimization program which includes automated exposure control, adjustment of the mA and/or kV according to patient size and/or use of iterative reconstruction technique. CONTRAST:  80mL OMNIPAQUE IOHEXOL 300 MG/ML  SOLN COMPARISON:  None Available. FINDINGS: Lower chest: No acute abnormality. Hepatobiliary: Hepatic steatosis is noted. Mild cholelithiasis is noted. No biliary dilatation is noted. Pancreas: Unremarkable. No pancreatic ductal dilatation or surrounding inflammatory changes.  Spleen: Normal in size without focal abnormality. Adrenals/Urinary Tract: Adrenal glands are unremarkable. Kidneys are normal, without renal calculi, focal lesion, or hydronephrosis. Bladder is unremarkable. Stomach/Bowel: The stomach is unremarkable. There is no evidence of small bowel dilatation. The appendix is not clearly visualized. There appears to be severe sigmoid diverticulitis present with multiple small pericolonic abscesses. The largest measures 6.3 x 2.8 cm best seen on image number 56 of series 8. Probable 3.6 x 3.1 cm abscess is noted posteriorly in the pelvis. There appears to be mild dilatation of the more proximal colon suggesting partial obstruction secondary to this inflammation. Vascular/Lymphatic: Aortic atherosclerosis. No enlarged abdominal or pelvic lymph nodes. Reproductive: Intrauterine device is noted. Adnexal regions are not well visualized due to previously described inflammation in the pelvis. Other: No definite hernia is noted. Musculoskeletal: No acute or significant osseous findings. IMPRESSION: Severe sigmoid diverticulitis is noted with multiple pericolonic abscess is present. The largest measures 6.3 x 2.8 cm anteriorly in the pelvis. Another measures 3.6 x 3.1 cm posteriorly. Mild dilatation of more proximal colon is noted suggesting partial obstruction secondary to this inflammation. Hepatic steatosis. Mild cholelithiasis. Aortic Atherosclerosis (ICD10-I70.0). Electronically Signed   By: Lupita Raider M.D.   On: 10/12/2023 17:51    Labs:  CBC: Recent Labs    10/12/23 1351 10/13/23 0348 10/14/23 0511  WBC 25.8* 30.9* 23.5*  HGB 10.7* 9.8* 9.4*  HCT 31.2* 29.1* 28.3*  PLT 530* 488* 487*    COAGS: Recent Labs    10/13/23 0828  INR 1.3*    BMP: Recent Labs    10/12/23 1351 10/13/23 0348 10/14/23 0511  NA 128* 127* 132*  K 3.1* 3.1* 3.4*  CL 93* 94* 98  CO2 22 18* 20*  GLUCOSE 102* 83 99  BUN 11 7* 12  CALCIUM 8.1* 7.7* 7.8*  CREATININE 0.55  0.41* 0.50  GFRNONAA >60 >60 >60    LIVER FUNCTION TESTS: Recent Labs    10/12/23 1351  BILITOT 0.7  AST 16  ALT 13  ALKPHOS 74  PROT 5.9*  ALBUMIN 2.3*    Assessment and Plan:  78 y/o F admitted for perforated diverticulitis with abscess s/p right TG drain placement in IR 10/13/23 seen today for drain follow up.  Drain Location: Right transgluteal Size: Fr size: 10 Fr Date of placement: 10/13/23  Currently to: Drain collection device: suction bulb 24 hour output:  Output by Drain (mL) 10/12/23 0701 - 10/12/23 1900 10/12/23 1901 - 10/13/23 0700 10/13/23 0701 - 10/13/23 1900 10/13/23 1901 - 10/14/23 0700 10/14/23 0701 - 10/14/23 1120  Closed System Drain 1 Right;Anterior Hip    10     Interval imaging/drain manipulation:  None  Current examination: Flushes with mild resistance/aspirates easily.  Insertion site unremarkable. Suture and stat lock in place. Dressed appropriately.   Plan: Continue TID flushes with 5 cc NS. Please be sure bulb is fully charged after flushing. Record output Q shift. Dressing changes QD or PRN if soiled.  Call IR APP or on call IR MD if difficulty flushing or sudden change in drain output.  Repeat imaging/possible drain injection once output < 10 mL/QD (excluding flush material). Consideration for drain removal if output is < 10 mL/QD (excluding flush material), pending discussion with the providing surgical service.  Discharge planning: Please contact IR APP or on call IR MD prior to patient d/c to ensure appropriate follow up plans are in place - I have placed follow up orders/discharge instructions/drain teaching orders today in case patient is discharged this weekend. Typically patient will follow up with IR clinic 10-14 days post d/c for repeat imaging/possible drain injection. IR scheduler will contact patient with date/time of appointment. Patient will need to flush drain QD with 5 cc NS, record output QD, dressing changes every 2-3 days or  earlier if soiled.   IR will continue to follow - please call with questions or concerns.  Electronically Signed: Villa Herb, PA-C 10/14/2023, 10:48 AM   I spent a total of 15 Minutes at the the patient's bedside AND on the patient's hospital floor or unit, greater than 50% of which was counseling/coordinating care for diverticulitis with abscess s/p drain placement.

## 2023-10-14 NOTE — Progress Notes (Signed)
 PROGRESS NOTE    Susan Fletcher  AVW:[id number] DOB: Oct 13, 1945 DOA: 10/12/2023 PCP: Patient, No Pcp Per    Brief Narrative:   Susan Fletcher is a 78 y.o. female with  no known significant medical history who presented to the ED for evaluation of abdominal pain. Patient reports about 3 weeks ago she woke from sleep around midnight with severe lower abdominal pain.  Pain felt like "knives stabbing" her in the abdomen.  This intense pain lasted about 2 days before she had some improvement.  She did not seek medical attention at that time.  She says since then she has had on and off episodes of recurrent severe abdominal pain although she has had some constant baseline pain.   For the last couple days she developed severe pain again.  She has had associated chills and diaphoresis.  She reports low appetite and significant decreased oral intake.  She says she has not had any bowel movements for quite some time.  She says she is not passing flatus.   She has not had a previous colonoscopy.  She denies any prior abdominal surgeries.  She does not take any medications except for occasional Excedrin for headache.   In the ED, BP 120/65, pulse 98, RR 17, temp 98.2 F, SpO2 100% on room air.  WBC 25.8, hemoglobin 10.7, platelets 530,000, sodium 120, potassium 3.1, bicarb 22, BUN 11, creatinine 0.55, serum glucose 102, LFTs within normal limits, lipase 25.  Lactic acid 1.4. UA showed 80 ketones, negative nitrites, small leukocytes, 0-5 RBCs and WBCs, no bacteria.  Blood cultures in process. CT abdomen/pelvis with contrast showed severe sigmoid diverticulitis with multiple pericolonic abscesses.  Largest measures 6.3 x 2.8 cm anteriorly in the pelvis.  Another measured 3.6 x 3.1 cm posteriorly.  Mild dilatation of more proximal colon is noted suggesting partial obstruction secondary to this inflammation.  Hepatic steatosis and mild cholelithiasis also noted.   Patient was given 1 L LR, IV Zosyn, IV K 10 mEq x 4.   EDP spoke with general surgery (Dr. Maisie Fus) who recommended medical admission and will see in consultation today.  The hospitalist service was consulted to admit.  Assessment & Plan:   Severe sigmoid diverticulitis with multiple pericolonic abscesses: Patient presenting with 3-week history of abdominal pain with acute worsening.  CT abdomen/pelvis with severe sigmoid diverticulitis with multiple pericolonic abscesses largest measuring 6.3 x 2.8 cm.  Additionally there is concern for associated partial colonic obstruction due to the inflammation.  Interventional radiology was consulted and patient underwent drain placement on 10/13/2023 -- General Surgery and IR following, appreciate assistance -- WBC 25.8>30.9>23.5 -- Blood cultures x 2: No growth x 2 days -- Operative culture with gram-negative rods, further identification and susceptibilities pending -- Zosyn 3.375 g IV every 8 hours -- Regular diet -- Pain control, antiemetics, supportive care -- Monitor drain output, will need outpatient follow-up with interventional radiology for further management -- CBC daily  Hypokalemia: Hypophosphatemia Hypomagnesemia Potassium 3.4, phosphorus 2.2, magnesium 1.7; will continue replacement of electrolytes --BMP daily with magnesium, phosphorus   Hyponatremia: Mild and secondary to volume depletion.   -- Na 829>562>130 -- Discontinue IV fluids -- BMP daily   Normocytic anemia: Mild without obvious bleeding.   -- Hgb 10.7>9.8>9.4 -- CBC daily   DVT prophylaxis: SCDs Start: 10/12/23 1939    Code Status: Full Code Family Communication: No family present at bedside this morning  Disposition Plan:  Level of care: Med-Surg Status is: Inpatient Remains inpatient appropriate because:  IV antibiotics, pending finalized operative culture for transition to oral antibiotics    Consultants:  General surgery Interventional radiology  Procedures:  Pending CT-guided drain placement by IR  3/28  Antimicrobials:  Zosyn 3/27>>   Subjective: Patient seen examined bedside, lying in bed.  Abdominal pain relatively resolved.  Tolerating advance diet.  Seen by general surgery this morning.  Operative culture growing gram-negative rods, awaiting further identification and susceptibilities, remains on IV Zosyn.  No other specific complaints, questions or concerns at this time.  Denies headache, no visual changes, no chest pain, no palpitations, no fever/chills/night sweats, no nausea/vomiting/diarrhea, no focal weakness, no fatigue, no paresthesia.  No acute events overnight per nurse staff.  Objective: Vitals:   10/13/23 1335 10/13/23 1412 10/13/23 1951 10/14/23 0531  BP: (!) 94/54 (!) 94/54 (!) 98/56 106/61  Pulse: 81 79 89 100  Resp: (!) 23 20 17 18   Temp:  97.9 F (36.6 C) 98.1 F (36.7 C) 99.2 F (37.3 C)  TempSrc:  Oral  Oral  SpO2: 100% 99% 96% 95%  Weight:      Height:        Intake/Output Summary (Last 24 hours) at 10/14/2023 1144 Last data filed at 10/14/2023 0630 Gross per 24 hour  Intake 1113.63 ml  Output 10 ml  Net 1103.63 ml   Filed Weights   10/12/23 1314  Weight: 40.6 kg    Examination:  Physical Exam: GEN: NAD, alert and oriented x 3, thin/elderly in appearance HEENT: NCAT, PERRL, EOMI, sclera clear, MMM PULM: CTAB w/o wheezes/crackles, normal respiratory effort, on room air CV: RRR w/o M/G/R GI: abd soft, NTND, + BS; drain noted MSK: no peripheral edema, muscle strength globally intact 5/5 bilateral upper/lower extremities NEURO: CN II-XII intact, no focal deficits, sensation to light touch intact PSYCH: normal mood/affect Integumentary: dry/intact, no rashes or wounds    Data Reviewed: I have personally reviewed following labs and imaging studies  CBC: Recent Labs  Lab 10/12/23 1351 10/13/23 0348 10/14/23 0511  WBC 25.8* 30.9* 23.5*  HGB 10.7* 9.8* 9.4*  HCT 31.2* 29.1* 28.3*  MCV 92.9 91.8 94.6  PLT 530* 488* 487*   Basic  Metabolic Panel: Recent Labs  Lab 10/12/23 1351 10/13/23 0348 10/14/23 0511  NA 128* 127* 132*  K 3.1* 3.1* 3.4*  CL 93* 94* 98  CO2 22 18* 20*  GLUCOSE 102* 83 99  BUN 11 7* 12  CREATININE 0.55 0.41* 0.50  CALCIUM 8.1* 7.7* 7.8*  MG  --   --  1.7  PHOS  --   --  2.2*   GFR: Estimated Creatinine Clearance: 37.1 mL/min (by C-G formula based on SCr of 0.5 mg/dL). Liver Function Tests: Recent Labs  Lab 10/12/23 1351  AST 16  ALT 13  ALKPHOS 74  BILITOT 0.7  PROT 5.9*  ALBUMIN 2.3*   Recent Labs  Lab 10/12/23 1351  LIPASE 25   No results for input(s): "AMMONIA" in the last 168 hours. Coagulation Profile: Recent Labs  Lab 10/13/23 0828  INR 1.3*   Cardiac Enzymes: No results for input(s): "CKTOTAL", "CKMB", "CKMBINDEX", "TROPONINI" in the last 168 hours. BNP (last 3 results) No results for input(s): "PROBNP" in the last 8760 hours. HbA1C: No results for input(s): "HGBA1C" in the last 72 hours. CBG: No results for input(s): "GLUCAP" in the last 168 hours. Lipid Profile: No results for input(s): "CHOL", "HDL", "LDLCALC", "TRIG", "CHOLHDL", "LDLDIRECT" in the last 72 hours. Thyroid Function Tests: No results for input(s): "TSH", "T4TOTAL", "FREET4", "  T3FREE", "THYROIDAB" in the last 72 hours. Anemia Panel: No results for input(s): "VITAMINB12", "FOLATE", "FERRITIN", "TIBC", "IRON", "RETICCTPCT" in the last 72 hours. Sepsis Labs: Recent Labs  Lab 10/12/23 1643 10/12/23 2107  LATICACIDVEN 1.4 0.9    Recent Results (from the past 240 hours)  Blood culture (routine x 2)     Status: None (Preliminary result)   Collection Time: 10/12/23  4:40 PM   Specimen: BLOOD  Result Value Ref Range Status   Specimen Description   Final    BLOOD SITE NOT SPECIFIED Performed at [facility], [address], [place], [place] [address]    Special Requests   Final    BOTTLES DRAWN AEROBIC AND ANAEROBIC Blood Culture results may not be optimal due to an  inadequate volume of blood received in culture bottles Performed at [facility], [address], [place], [place] [address]    Culture   Final    NO GROWTH 2 DAYS Performed at [facility], [address] [place], [place] [address]    Report Status PENDING  Incomplete  Blood culture (routine x 2)     Status: None (Preliminary result)   Collection Time: 10/12/23  4:43 PM   Specimen: BLOOD  Result Value Ref Range Status   Specimen Description   Final    BLOOD SITE NOT SPECIFIED Performed at [facility], [address], [place], [place] [address]    Special Requests   Final    BOTTLES DRAWN AEROBIC AND ANAEROBIC Blood Culture results may not be optimal due to an inadequate volume of blood received in culture bottles Performed at [facility], [address], [place], [place] [address]    Culture   Final    NO GROWTH 2 DAYS Performed at [facility], [address], [place], [place] [address]    Report Status PENDING  Incomplete  Aerobic/Anaerobic Culture w Gram Stain (surgical/deep wound)     Status: None (Preliminary result)   Collection Time: 10/13/23  2:31 PM   Specimen: Abscess  Result Value Ref Range Status   Specimen Description   Final    ABSCESS Performed at [facility], [address] [place], [place] [address]    Special Requests   Final    NONE Performed at [facility], [address], [place], [place] [address]    Gram Stain   Final    ABUNDANT WBC PRESENT, PREDOMINANTLY PMN FEW GRAM NEGATIVE RODS    Culture   Final    RARE GRAM NEGATIVE RODS SUSCEPTIBILITIES TO FOLLOW Performed at [facility], [address], [place], [place] [address]    Report Status PENDING  Incomplete         Radiology Studies: CT GUIDED PERITONEAL/RETROPERITONEAL FLUID DRAIN BY PERC CATH Result Date: 10/13/2023 INDICATION: 78 year old female with perforated  diverticulitis and perirectal abscess. EXAM: CT-guided drain placement TECHNIQUE: Multidetector CT imaging of the low abdomen/pelvis was performed following the standard protocol without IV contrast. RADIATION DOSE REDUCTION: This exam was performed according to the departmental dose-optimization program which includes automated exposure control, adjustment of the mA and/or kV according to patient size and/or use of iterative reconstruction technique. MEDICATIONS: The patient is currently admitted to the hospital and receiving intravenous antibiotics. The antibiotics were administered within an appropriate time frame prior to the initiation of the procedure. ANESTHESIA/SEDATION: Moderate (conscious) sedation was employed during this procedure. A total of Versed  2 mg and Fentanyl 50 mcg was administered intravenously by the radiology nurse. Total intra-service moderate Sedation Time: 15 minutes. The patient's level of consciousness and vital signs were monitored continuously by radiology nursing throughout the procedure under my direct supervision. COMPLICATIONS: None immediate. PROCEDURE: Informed written consent was obtained from the patient after a thorough discussion of the procedural risks, benefits and alternatives. All questions were addressed. Maximal Sterile Barrier Technique was utilized including caps, mask, sterile gowns, sterile gloves, sterile drape, hand hygiene and skin antiseptic. A timeout was performed prior to the initiation of the procedure. Axial CT imaging was performed. The small abscess collection anterior to the sacrum and just superior to the rectum was successfully localized. A suitable skin entry site was selected and marked. Local anesthesia was attained by infiltration with 1% lidocaine. A small dermatotomy was made. Utilizing intermittent CT guidance, an 18 gauge trocar needle was carefully advanced along a right parasacral approach into the fluid collection. A 0.035 wire was then  coiled in the fluid collection. Percutaneous tract was dilated to 10 [place]. A skater 10 [place] all-purpose drainage catheter was advanced over the wire and formed. Aspiration yields 7 mL of frankly purulent fluid. Samples were sent for Gram stain and culture. The drainage catheter was gently flushed, connected to JP bulb suction and then secured to the skin with 0 Prolene suture. Follow-up CT imaging demonstrates a well-positioned drainage catheter with no evidence of complication. IMPRESSION: Successful placement of 10 French drainage catheter via a right transgluteal approach into the perirectal abscess. Electronically Signed   By: Malachy Moan M.D.   On: 10/13/2023 14:30   CT ABDOMEN PELVIS W CONTRAST Result Date: 10/12/2023 CLINICAL DATA:  Acute generalized abdominal pain. EXAM: CT ABDOMEN AND PELVIS WITH CONTRAST TECHNIQUE: Multidetector CT imaging of the abdomen and pelvis was performed using the standard protocol following bolus administration of intravenous contrast. RADIATION DOSE REDUCTION: This exam was performed according to the departmental dose-optimization program which includes automated exposure control, adjustment of the mA and/or kV according to patient size and/or use of iterative reconstruction technique. CONTRAST:  80mL OMNIPAQUE IOHEXOL 300 MG/ML  SOLN COMPARISON:  None Available. FINDINGS: Lower chest: No acute abnormality. Hepatobiliary: Hepatic steatosis is noted. Mild cholelithiasis is noted. No biliary dilatation is noted. Pancreas: Unremarkable. No pancreatic ductal dilatation or surrounding inflammatory changes. Spleen: Normal in size without focal abnormality. Adrenals/Urinary Tract: Adrenal glands are unremarkable. Kidneys are normal, without renal calculi, focal lesion, or hydronephrosis. Bladder is unremarkable. Stomach/Bowel: The stomach is unremarkable. There is no evidence of small bowel dilatation. The appendix is not clearly visualized. There appears to be severe  sigmoid diverticulitis present with multiple small pericolonic abscesses. The largest measures 6.3 x 2.8 cm best seen on image number 56 of series 8. Probable 3.6 x 3.1 cm abscess is noted posteriorly in the pelvis. There appears to be mild dilatation of the more proximal colon suggesting partial obstruction secondary to this inflammation. Vascular/Lymphatic: Aortic atherosclerosis. No enlarged abdominal or pelvic lymph nodes. Reproductive: Intrauterine device is noted. Adnexal regions are not well visualized due to previously described inflammation in the pelvis. Other: No definite hernia is noted. Musculoskeletal: No acute or significant osseous findings. IMPRESSION: Severe sigmoid diverticulitis is noted with multiple pericolonic abscess is present. The largest measures 6.3 x 2.8 cm anteriorly in the pelvis. Another measures 3.6 x 3.1 cm posteriorly. Mild dilatation of more proximal colon is noted suggesting partial obstruction secondary to this inflammation. Hepatic steatosis. Mild cholelithiasis. Aortic Atherosclerosis (ICD10-I70.0). Electronically  Signed   By: Lupita Raider M.D.   On: 10/12/2023 17:51        Scheduled Meds:  sodium chloride flush  5 mL Intracatheter Q8H   Continuous Infusions:  piperacillin-tazobactam (ZOSYN)  IV 3.375 g (10/14/23 1011)   sodium PHOSPHATE IVPB (in mmol) 15 mmol (10/14/23 0821)     LOS: 2 days    Time spent: 48 minutes spent on 10/14/2023 caring for this patient face-to-face including chart review, ordering labs/tests, documenting, discussion with nursing staff, consultants, updating family and interview/physical exam    Alvira Philips [place], DO [facility] Available via Epic secure chat 7am-7pm After these hours, please refer to coverage provider listed on [email or web address] 10/14/2023, 11:44 AM

## 2023-10-15 ENCOUNTER — Inpatient Hospital Stay (HOSPITAL_COMMUNITY)

## 2023-10-15 DIAGNOSIS — K5720 Diverticulitis of large intestine with perforation and abscess without bleeding: Secondary | ICD-10-CM | POA: Diagnosis not present

## 2023-10-15 LAB — FOLATE: Folate: 5.2 ng/mL — ABNORMAL LOW (ref >5.9)

## 2023-10-15 LAB — RETICULOCYTES
Immature Retic Fract: 19.3 % — ABNORMAL HIGH (ref 2.3–15.9)
RBC.: 1.7 MIL/uL — ABNORMAL LOW (ref 3.87–5.11)
Retic Count, Absolute: 18.5 10*3/uL — ABNORMAL LOW (ref 19.0–186.0)
Retic Ct Pct: 1.1 % (ref 0.4–3.1)

## 2023-10-15 LAB — MAGNESIUM: Magnesium: 1.9 mg/dL (ref 1.7–2.4)

## 2023-10-15 LAB — BASIC METABOLIC PANEL WITH GFR
Anion gap: 7 (ref 5–15)
BUN: 36 mg/dL — ABNORMAL HIGH (ref 8–23)
CO2: 26 mmol/L (ref 22–32)
Calcium: 7.5 mg/dL — ABNORMAL LOW (ref 8.9–10.3)
Chloride: 101 mmol/L (ref 98–111)
Creatinine, Ser: 0.47 mg/dL (ref 0.44–1.00)
GFR, Estimated: >60 mL/min (ref >60)
Glucose, Bld: 114 mg/dL — ABNORMAL HIGH (ref 70–99)
Potassium: 3.8 mmol/L (ref 3.5–5.1)
Sodium: 134 mmol/L — ABNORMAL LOW (ref 135–145)

## 2023-10-15 LAB — CBC
HCT: 19.6 % — ABNORMAL LOW (ref 36.0–46.0)
Hemoglobin: 6.5 g/dL — CL (ref 12.0–15.0)
MCH: 30.8 pg (ref 26.0–34.0)
MCHC: 33.2 g/dL (ref 30.0–36.0)
MCV: 92.9 fL (ref 80.0–100.0)
Platelets: 448 10*3/uL — ABNORMAL HIGH (ref 150–400)
RBC: 2.11 MIL/uL — ABNORMAL LOW (ref 3.87–5.11)
RDW: 14.3 % (ref 11.5–15.5)
WBC: 17.3 10*3/uL — ABNORMAL HIGH (ref 4.0–10.5)
nRBC: 0 % (ref 0.0–0.2)

## 2023-10-15 LAB — IRON AND TIBC
Iron: 59 ug/dL (ref 28–170)
Saturation Ratios: 39 % — ABNORMAL HIGH (ref 10.4–31.8)
TIBC: 151 ug/dL — ABNORMAL LOW (ref 250–450)
UIBC: 92 ug/dL

## 2023-10-15 LAB — FERRITIN: Ferritin: 89 ng/mL (ref 11–307)

## 2023-10-15 LAB — HEMOGLOBIN AND HEMATOCRIT, BLOOD
HCT: 16 % — ABNORMAL LOW (ref 36.0–46.0)
Hemoglobin: 5.3 g/dL — CL (ref 12.0–15.0)

## 2023-10-15 LAB — OCCULT BLOOD X 1 CARD TO LAB, STOOL: Fecal Occult Bld: POSITIVE — AB

## 2023-10-15 LAB — ABO/RH: ABO/RH(D): A POS

## 2023-10-15 LAB — VITAMIN B12: Vitamin B-12: 549 pg/mL (ref 180–914)

## 2023-10-15 LAB — PREPARE RBC (CROSSMATCH)

## 2023-10-15 LAB — PHOSPHORUS: Phosphorus: 1.5 mg/dL — ABNORMAL LOW (ref 2.5–4.6)

## 2023-10-15 MED ORDER — PANTOPRAZOLE SODIUM 40 MG IV SOLR: 40.0000 mg | Freq: Two times a day (BID) | INTRAVENOUS | Status: DC

## 2023-10-15 MED ORDER — PANTOPRAZOLE SODIUM 40 MG IV SOLR
40.0000 mg | INTRAVENOUS | Status: AC
Start: 2023-10-15 — End: 2023-10-15

## 2023-10-15 MED ORDER — SODIUM PHOSPHATES 45 MMOLE/15ML IV SOLN
30.0000 mmol | Freq: Once | INTRAVENOUS | Status: AC
  Administered 2023-10-15: 30 mmol via INTRAVENOUS
  Filled 2023-10-15: qty 10

## 2023-10-15 MED ORDER — SODIUM CHLORIDE 0.9% IV SOLUTION: Freq: Once | INTRAVENOUS | Status: AC

## 2023-10-15 MED ORDER — FOLIC ACID 1 MG PO TABS
1.0000 mg | ORAL_TABLET | Freq: Every day | ORAL | Status: DC
  Administered 2023-10-15 – 2023-10-19 (×4): 1 mg via ORAL
  Filled 2023-10-15 (×4): qty 1

## 2023-10-15 MED ORDER — IOHEXOL 350 MG/ML SOLN
80.0000 mL | Freq: Once | INTRAVENOUS | Status: AC | PRN
  Administered 2023-10-15: 80 mL via INTRAVENOUS

## 2023-10-15 MED ORDER — PANTOPRAZOLE SODIUM 40 MG IV SOLR
40.0000 mg | Freq: Two times a day (BID) | INTRAVENOUS | Status: DC
  Administered 2023-10-16 – 2023-10-17 (×4): 40 mg via INTRAVENOUS
  Filled 2023-10-15 (×4): qty 10

## 2023-10-15 NOTE — Progress Notes (Signed)
 Subjective/Chief Complaint: Patient feels weak and tired.  Hemoglobin 6.5 on lab work today.  No blood in stool or emesis.  No blood in urine.   Objective: Vital signs in last 24 hours: Temp:  [98 F (36.7 C)-99.3 F (37.4 C)] 98 F (36.7 C) (03/30 0500) Pulse Rate:  [97-109] 105 (03/30 0654) Resp:  [17] 17 (03/30 0500) BP: (91-120)/(55-71) 91/55 (03/30 0654) SpO2:  [97 %-99 %] 99 % (03/30 0500) Last BM Date : 10/12/23  Intake/Output from previous day: 03/29 0701 - 03/30 0700 In: 668.6 [P.O.:360; IV Piggyback:308.6] Out: 10 [Drains:10] Intake/Output this shift: No intake/output data recorded.  Abdomen: Soft nontender without rebound guarding.  Drain noted with scant amount of dark fluid.  No blood.  Lab Results:  Recent Labs    10/14/23 0511 10/15/23 0439  WBC 23.5* 17.3*  HGB 9.4* 6.5*  HCT 28.3* 19.6*  PLT 487* 448*   BMET Recent Labs    10/14/23 0511 10/15/23 0439  NA 132* 134*  K 3.4* 3.8  CL 98 101  CO2 20* 26  GLUCOSE 99 114*  BUN 12 36*  CREATININE 0.50 0.47  CALCIUM 7.8* 7.5*   PT/INR Recent Labs    10/13/23 0828  LABPROT 15.9*  INR 1.3*   ABG No results for input(s): "PHART", "HCO3" in the last 72 hours.  Invalid input(s): "PCO2", "PO2"  Studies/Results: CT GUIDED PERITONEAL/RETROPERITONEAL FLUID DRAIN BY PERC CATH Result Date: 10/13/2023 INDICATION: 78 year old female with perforated diverticulitis and perirectal abscess. EXAM: CT-guided drain placement TECHNIQUE: Multidetector CT imaging of the low abdomen/pelvis was performed following the standard protocol without IV contrast. RADIATION DOSE REDUCTION: This exam was performed according to the departmental dose-optimization program which includes automated exposure control, adjustment of the mA and/or kV according to patient size and/or use of iterative reconstruction technique. MEDICATIONS: The patient is currently admitted to the hospital and receiving intravenous antibiotics. The  antibiotics were administered within an appropriate time frame prior to the initiation of the procedure. ANESTHESIA/SEDATION: Moderate (conscious) sedation was employed during this procedure. A total of Versed 2 mg and Fentanyl 50 mcg was administered intravenously by the radiology nurse. Total intra-service moderate Sedation Time: 15 minutes. The patient's level of consciousness and vital signs were monitored continuously by radiology nursing throughout the procedure under my direct supervision. COMPLICATIONS: None immediate. PROCEDURE: Informed written consent was obtained from the patient after a thorough discussion of the procedural risks, benefits and alternatives. All questions were addressed. Maximal Sterile Barrier Technique was utilized including caps, mask, sterile gowns, sterile gloves, sterile drape, hand hygiene and skin antiseptic. A timeout was performed prior to the initiation of the procedure. Axial CT imaging was performed. The small abscess collection anterior to the sacrum and just superior to the rectum was successfully localized. A suitable skin entry site was selected and marked. Local anesthesia was attained by infiltration with 1% lidocaine. A small dermatotomy was made. Utilizing intermittent CT guidance, an 18 gauge trocar needle was carefully advanced along a right parasacral approach into the fluid collection. A 0.035 wire was then coiled in the fluid collection. Percutaneous tract was dilated to 10 [place]. A skater 10 [place] all-purpose drainage catheter was advanced over the wire and formed. Aspiration yields 7 mL of frankly purulent fluid. Samples were sent for Gram stain and culture. The drainage catheter was gently flushed, connected to JP bulb suction and then secured to the skin with 0 Prolene suture. Follow-up CT imaging demonstrates a well-positioned drainage catheter with no evidence of  complication. IMPRESSION: Successful placement of 10 French drainage catheter via a right  transgluteal approach into the perirectal abscess. Electronically Signed   By: Malachy Moan M.D.   On: 10/13/2023 14:30    Anti-infectives: Anti-infectives (From admission, onward)    Start     Dose/Rate Route Frequency Ordered Stop   10/13/23 0200  piperacillin-tazobactam (ZOSYN) IVPB 3.375 g        3.375 g 12.5 mL/hr over 240 Minutes Intravenous Every 8 hours 10/12/23 2040     10/12/23 1845  piperacillin-tazobactam (ZOSYN) IVPB 3.375 g        3.375 g 100 mL/hr over 30 Minutes Intravenous  Once 10/12/23 1835 10/12/23 2042       Assessment/Plan: Diverticular abscess-IR drain in place.  Abdomen is benign on exam.  Recheck H&H this a.m.  Regular diet  Stable from a surgical standpoint.  No acute need for surgery.  From surgery standpoint, she can be discharged with her drain in place once she is medically stable for this.   LOS: 3 days   LOW  Dortha Schwalbe MD 10/15/2023

## 2023-10-15 NOTE — Progress Notes (Signed)
 Attempted to call report to [facility] nurse.  Was asked to call back in 10-15 min as nurse is off floor at CT

## 2023-10-15 NOTE — H&P (View-Only) (Signed)
 UNASSIGNED PATIENT Reason for Consult:Melena with anemia. Referring Physician: [facility].  Susan Fletcher is an 77 y.o. female.  HPI: Patient is a 78 year old [place] female who has been quite healthy and has never had medical care except for the fracture fracture of her right wrist several years ago.  She presented the emergency room with abdominal pain on 10/12/2023 and was found to have severe sigmoid diverticulitis with multiple pericolonic abscess on the CT scan along with hepatic steatosis and mild cholelithiasis. She was treated with broad-spectrum antibiotics-Zosyn On admission her hemoglobin was 10.7 g/dL down from 32.4 g/dL in April 2023 and since then her hemoglobin is gradually drifted down to 5.3 g/dL today with a elevation in her BUN from 7 to 12-36 today.  Patient's had some black-colored stools since this afternoon but denies having any abdominal pain nausea vomiting; there is no history of bright red bleeding per rectum.  Has never had a colonoscopy and does not have a primary care provider as she does not feel she needs to see a doctor as she has no medical problems. She denies use of OTC nonsteroidals sed rate is mentioned on her list of medications from home.  There is no history previous history of peptic ulcer disease.  She has regular bowel movements.  Her appetite has been fairly good and her weight has been stable.  Past Medical History:  Diagnosis Date   Headache    Past Surgical History:  Procedure Laterality Date   DILATION AND CURETTAGE, DIAGNOSTIC / THERAPEUTIC     ORIF WRIST FRACTURE Right 04/30/2018   Procedure: OPEN REDUCTION INTERNAL FIXATION (ORIF) RIGHT DISTAL RADIUS;  Surgeon: [name], MD;  Location: [facility];  Service: Orthopedics;  Laterality: Right;   Family History  Problem Relation Age of Onset   Healthy Mother    Social History:  reports that she has quit smoking. Her smoking use included cigarettes. She has never used  smokeless tobacco. She reports no current alcohol use. She reports that she does not use drugs. She lives alone husband died in 82. She has no children she is originally from [place].  She has no family support and most of her family lives in [place].  She is not involved in any church locally.  Allergies: No Known Allergies  Medications: I have reviewed the patient's current medications. Prior to Admission:  Medications Prior to Admission  Medication Sig Dispense Refill Last Dose/Taking   aspirin-acetaminophen-caffeine (EXCEDRIN MIGRAINE) 250-250-65 MG tablet Take 1 tablet by mouth every 6 (six) hours as needed for headache.   Past Week   ibuprofen (ADVIL) 200 MG tablet Take 200 mg by mouth every 6 (six) hours as needed for mild pain (pain score 1-3).   Past Week   Scheduled:  sodium chloride   Intravenous Once   folic acid  1 mg Oral Daily   [START ON 10/16/2023] pantoprazole (PROTONIX) IV  40 mg Intravenous Q12H   sodium chloride flush  5 mL Intracatheter Q8H   Continuous:  piperacillin-tazobactam (ZOSYN)  IV 3.375 g (10/15/23 1048)   sodium PHOSPHATE IVPB (in mmol) 30 mmol (10/15/23 1049)   MWN:[id number] **OR** acetaminophen, morphine injection, ondansetron **OR** ondansetron (ZOFRAN) IV  Results for orders placed or performed during the hospital encounter of 10/12/23 (from the past 48 hours)  CBC     Status: Abnormal   Collection Time: 10/14/23  5:11 AM  Result Value Ref Range   WBC 23.5 (H) 4.0 - 10.5 K/uL   RBC  2.99 (L) 3.87 - 5.11 MIL/uL   Hemoglobin 9.4 (L) 12.0 - 15.0 g/dL   HCT 91.4 (L) 78.2 - 95.6 %   MCV 94.6 80.0 - 100.0 fL   MCH 31.4 26.0 - 34.0 pg   MCHC 33.2 30.0 - 36.0 g/dL   RDW 21.3 08.6 - 57.8 %   Platelets 487 (H) 150 - 400 K/uL   nRBC 0.0 0.0 - 0.2 %    Comment: Performed at [facility] [address] [place], [place] [address]  Basic metabolic panel with GFR     Status: Abnormal   Collection Time: 10/14/23  5:11 AM  Result  Value Ref Range   Sodium 132 (L) 135 - 145 mmol/L   Potassium 3.4 (L) 3.5 - 5.1 mmol/L   Chloride 98 98 - 111 mmol/L   CO2 20 (L) 22 - 32 mmol/L   Glucose, Bld 99 70 - 99 mg/dL    Comment: Glucose reference range applies only to samples taken after fasting for at least 8 hours.   BUN 12 8 - 23 mg/dL   Creatinine, Ser 9.52 0.44 - 1.00 mg/dL   Calcium 7.8 (L) 8.9 - 10.3 mg/dL   GFR, Estimated >84 >13 mL/min    Comment: (NOTE) Calculated using the CKD-EPI Creatinine Equation (2021)    Anion gap 14 5 - 15    Comment: Performed at [facility], [address], [place], [place] [address]  Magnesium     Status: None   Collection Time: 10/14/23  5:11 AM  Result Value Ref Range   Magnesium 1.7 1.7 - 2.4 mg/dL    Comment: Performed at [facility], [address], [place], [place] [address]  Phosphorus     Status: Abnormal   Collection Time: 10/14/23  5:11 AM  Result Value Ref Range   Phosphorus 2.2 (L) 2.5 - 4.6 mg/dL    Comment: Performed at [facility], [address] [place], [place] [address]  CBC     Status: Abnormal   Collection Time: 10/15/23  4:39 AM  Result Value Ref Range   WBC 17.3 (H) 4.0 - 10.5 K/uL   RBC 2.11 (L) 3.87 - 5.11 MIL/uL   Hemoglobin 6.5 (LL) 12.0 - 15.0 g/dL    Comment: REPEATED TO VERIFY THIS CRITICAL RESULT HAS VERIFIED AND BEEN CALLED TO ANGELA STROUD , RN BY MEGAN HAYES ON 03 30 2025 AT 0532, AND HAS BEEN READ BACK. CRITICAL RESULT VERIFIED    HCT 19.6 (L) 36.0 - 46.0 %   MCV 92.9 80.0 - 100.0 fL   MCH 30.8 26.0 - 34.0 pg   MCHC 33.2 30.0 - 36.0 g/dL   RDW 03.4 74.2 - 59.5 %   Platelets 448 (H) 150 - 400 K/uL   nRBC 0.0 0.0 - 0.2 %    Comment: Performed at [facility], [address] [place], [place] [address]  Basic metabolic panel with GFR     Status: Abnormal   Collection Time: 10/15/23  4:39 AM  Result Value Ref Range   Sodium 134 (L) 135 - 145 mmol/L   Potassium 3.8  3.5 - 5.1 mmol/L   Chloride 101 98 - 111 mmol/L   CO2 26 22 - 32 mmol/L   Glucose, Bld 114 (H) 70 - 99 mg/dL    Comment: Glucose reference range applies only to samples taken after fasting for at least 8 hours.   BUN 36 (H) 8 - 23  mg/dL   Creatinine, Ser 8.29 0.44 - 1.00 mg/dL   Calcium 7.5 (L) 8.9 - 10.3 mg/dL   GFR, Estimated >56 >21 mL/min    Comment: (NOTE) Calculated using the CKD-EPI Creatinine Equation (2021)    Anion gap 7 5 - 15    Comment: Performed at [facility], [address] [place], [place] [address]  Magnesium     Status: None   Collection Time: 10/15/23  4:39 AM  Result Value Ref Range   Magnesium 1.9 1.7 - 2.4 mg/dL    Comment: Performed at [facility], [address] [place], [place] [address]  Phosphorus     Status: Abnormal   Collection Time: 10/15/23  4:39 AM  Result Value Ref Range   Phosphorus 1.5 (L) 2.5 - 4.6 mg/dL    Comment: Performed at [facility], [address], [place], [place] [address]  ABO/Rh     Status: None   Collection Time: 10/15/23  4:39 AM  Result Value Ref Range   ABO/RH(D)      A POS Performed at [facility], [address] [place], [place] [address]   Hemoglobin and hematocrit, blood     Status: Abnormal   Collection Time: 10/15/23 11:59 AM  Result Value Ref Range   Hemoglobin 5.3 (LL) 12.0 - 15.0 g/dL    Comment: This critical result has verified and been called to ROSE, B by LINDSAY,CELESTE on 03 30 2025 at 1237, and has been read back.    HCT 16.0 (L) 36.0 - 46.0 %    Comment: Performed at [facility], [address], [place], [place] [address]  Type and screen [facility]  [facility]     Status: None (Preliminary result)   Collection Time: 10/15/23 11:59 AM  Result Value Ref Range   ABO/RH(D) A POS    Antibody Screen NEG    Sample Expiration      10/18/2023,2359 Performed at [facility], [address] [place], [place] [address]    Unit Number [id number]    Blood Component Type RED CELLS,LR    Unit division 00    Status of Unit ALLOCATED    Transfusion Status OK TO TRANSFUSE    Crossmatch Result Compatible    Unit Number [id number]    Blood Component Type RED CELLS,LR    Unit division 00    Status of Unit ALLOCATED    Transfusion Status OK TO TRANSFUSE    Crossmatch Result Compatible   Reticulocytes     Status: Abnormal   Collection Time: 10/15/23 11:59 AM  Result Value Ref Range   Retic Ct Pct 1.1 0.4 - 3.1 %   RBC. 1.70 (L) 3.87 - 5.11 MIL/uL   Retic Count, Absolute 18.5 (L) 19.0 - 186.0 K/uL   Immature Retic Fract 19.3 (H) 2.3 - 15.9 %    Comment: Performed at [facility], [address] [place], [place] [address]  Vitamin B12     Status: None   Collection Time: 10/15/23 12:00 PM  Result Value Ref Range   Vitamin B-12 549 180 - 914 pg/mL    Comment: (NOTE) This assay is not validated for testing neonatal or myeloproliferative syndrome specimens for Vitamin B12 levels. Performed at [facility], [address] [place], [place] [address]   Folate     Status: Abnormal   Collection Time: 10/15/23 12:00 PM  Result Value Ref Range  Folate 5.2 (L) >5.9 ng/mL    Comment: Performed at [facility], [address], [place], [place] [address]  Iron and TIBC     Status: Abnormal   Collection Time: 10/15/23 12:00 PM  Result Value Ref Range   Iron 59 28 - 170 ug/dL   TIBC 604 (L) 540 - 981 ug/dL   Saturation Ratios 39 (H) 10.4 - 31.8 %   UIBC 92 ug/dL    Comment: Performed at [facility], [address], [place], [place] [address]  Ferritin     Status: None   Collection Time: 10/15/23 12:00 PM  Result Value Ref Range   Ferritin 89 11 - 307 ng/mL    Comment: Performed at [facility], [address], [place], [place] [address]  Prepare RBC (crossmatch)     Status:  None   Collection Time: 10/15/23  1:46 PM  Result Value Ref Range   Order Confirmation      ORDER PROCESSED BY BLOOD BANK Performed at [facility], [address], [place], [place] [address]    Review of Systems  Constitutional:  Positive for activity change and fatigue. Negative for chills, diaphoresis and unexpected weight change.  HENT: Negative.    Eyes: Negative.   Respiratory: Negative.    Cardiovascular: Negative.   Gastrointestinal:  Positive for blood in stool. Negative for abdominal distention, abdominal pain, anal bleeding, constipation, diarrhea and nausea.  Endocrine: Negative.   Genitourinary: Negative.   Musculoskeletal:  Positive for arthralgias.  Allergic/Immunologic: Negative.   Neurological: Negative.   Hematological: Negative.   Psychiatric/Behavioral:  The patient is nervous/anxious.    Blood pressure (!) 97/57, pulse (!) 119, temperature 99.6 F (37.6 C), temperature source Oral, resp. rate 17, height 5\' 1"  (1.549 m), weight 40.6 kg, SpO2 99%. Physical Exam Constitutional:      General: She is not in acute distress.    Appearance: She is ill-appearing. She is not toxic-appearing or diaphoretic.  HENT:     Head: Normocephalic and atraumatic.  Eyes:     Pupils: Pupils are equal, round, and reactive to light.  Cardiovascular:     Rate and Rhythm: Normal rate and regular rhythm.  Pulmonary:     Effort: Pulmonary effort is normal.     Breath sounds: Normal breath sounds.  Abdominal:     General: Bowel sounds are normal.     Palpations: Abdomen is soft.     Tenderness: There is no abdominal tenderness.  Skin:    General: Skin is warm and dry.  Neurological:     General: No focal deficit present.     Mental Status: She is alert and oriented to person, place, and time.   Assessment/Plan: 1) Severe sigmoid diverticulitis with multiple pericolonic abscesses with additional concern for partial colonic obstruction due to the  inflammation-abnormal CT scan-on Zosyn. 2) Severe anemia with melenic stools-plans are to do EGD tomorrow. 3) Hyponatremia, hypokalemia and hypophosphatemia-monitor and replaced. Charna Elizabeth 10/15/2023, 3:08 PM

## 2023-10-15 NOTE — Consult Note (Signed)
 UNASSIGNED Susan Fletcher Reason for Consult:Melena with anemia. Referring Physician: [facility].  Susan Fletcher is an 78 y.o. female.  HPI: Susan Fletcher is a 78 year old [place] female who has been quite healthy and has never had medical care except for the fracture fracture of her right wrist several years ago.  Susan Fletcher presented the emergency room with abdominal pain on 10/12/2023 and was found to have severe sigmoid diverticulitis with multiple pericolonic abscess on the CT scan along with hepatic steatosis and mild cholelithiasis. Susan Fletcher was treated with broad-spectrum antibiotics-Zosyn On admission her hemoglobin was 10.7 g/dL down from 32.4 g/dL in April 2023 and since then her hemoglobin is gradually drifted down to 5.3 g/dL today with a elevation in her BUN from 7 to 12-36 today.  Susan Fletcher's had some black-colored stools since this afternoon but denies having any abdominal pain nausea vomiting; there is no history of bright red bleeding per rectum.  Has never had a colonoscopy and does not have a primary care provider as Susan Fletcher does not feel Susan Fletcher needs to see a doctor as Susan Fletcher has no medical problems. Susan Fletcher denies use of OTC nonsteroidals sed rate is mentioned on her list of medications from home.  There is no history previous history of peptic ulcer disease.  Susan Fletcher has regular bowel movements.  Her appetite has been fairly good and her weight has been stable.  Past Medical History:  Diagnosis Date   Headache    Past Surgical History:  Procedure Laterality Date   DILATION AND CURETTAGE, DIAGNOSTIC / THERAPEUTIC     ORIF WRIST FRACTURE Right 04/30/2018   Procedure: OPEN REDUCTION INTERNAL FIXATION (ORIF) RIGHT DISTAL RADIUS;  Surgeon: [name], MD;  Location: [facility];  Service: Orthopedics;  Laterality: Right;   Family History  Problem Relation Age of Onset   Healthy Mother    Social History:  reports that Susan Fletcher has quit smoking. Her smoking use included cigarettes. Susan Fletcher has never used  smokeless tobacco. Susan Fletcher reports no current alcohol use. Susan Fletcher reports that Susan Fletcher does not use drugs. Susan Fletcher lives alone husband died in 82. Susan Fletcher has no children Susan Fletcher is originally from [place].  Susan Fletcher has no family support and most of her family lives in [place].  Susan Fletcher is not involved in any church locally.  Allergies: No Known Allergies  Medications: I have reviewed the Susan Fletcher's current medications. Prior to Admission:  Medications Prior to Admission  Medication Sig Dispense Refill Last Dose/Taking   aspirin-acetaminophen-caffeine (EXCEDRIN MIGRAINE) 250-250-65 MG tablet Take 1 tablet by mouth every 6 (six) hours as needed for headache.   Past Week   ibuprofen (ADVIL) 200 MG tablet Take 200 mg by mouth every 6 (six) hours as needed for mild pain (pain score 1-3).   Past Week   Scheduled:  sodium chloride   Intravenous Once   folic acid  1 mg Oral Daily   [START ON 10/16/2023] pantoprazole (PROTONIX) IV  40 mg Intravenous Q12H   sodium chloride flush  5 mL Intracatheter Q8H   Continuous:  piperacillin-tazobactam (ZOSYN)  IV 3.375 g (10/15/23 1048)   sodium PHOSPHATE IVPB (in mmol) 30 mmol (10/15/23 1049)   MWN:[id number] **OR** acetaminophen, morphine injection, ondansetron **OR** ondansetron (ZOFRAN) IV  Results for orders placed or performed during the hospital encounter of 10/12/23 (from the past 48 hours)  CBC     Status: Abnormal   Collection Time: 10/14/23  5:11 AM  Result Value Ref Range   WBC 23.5 (H) 4.0 - 10.5 K/uL   RBC  2.99 (L) 3.87 - 5.11 MIL/uL   Hemoglobin 9.4 (L) 12.0 - 15.0 g/dL   HCT 91.4 (L) 78.2 - 95.6 %   MCV 94.6 80.0 - 100.0 fL   MCH 31.4 26.0 - 34.0 pg   MCHC 33.2 30.0 - 36.0 g/dL   RDW 21.3 08.6 - 57.8 %   Platelets 487 (H) 150 - 400 K/uL   nRBC 0.0 0.0 - 0.2 %    Comment: Performed at [facility] [address] [place], [place] [address]  Basic metabolic panel with GFR     Status: Abnormal   Collection Time: 10/14/23  5:11 AM  Result  Value Ref Range   Sodium 132 (L) 135 - 145 mmol/L   Potassium 3.4 (L) 3.5 - 5.1 mmol/L   Chloride 98 98 - 111 mmol/L   CO2 20 (L) 22 - 32 mmol/L   Glucose, Bld 99 70 - 99 mg/dL    Comment: Glucose reference range applies only to samples taken after fasting for at least 8 hours.   BUN 12 8 - 23 mg/dL   Creatinine, Ser 9.52 0.44 - 1.00 mg/dL   Calcium 7.8 (L) 8.9 - 10.3 mg/dL   GFR, Estimated >84 >13 mL/min    Comment: (NOTE) Calculated using the CKD-EPI Creatinine Equation (2021)    Anion gap 14 5 - 15    Comment: Performed at [facility], [address], [place], [place] [address]  Magnesium     Status: None   Collection Time: 10/14/23  5:11 AM  Result Value Ref Range   Magnesium 1.7 1.7 - 2.4 mg/dL    Comment: Performed at [facility], [address], [place], [place] [address]  Phosphorus     Status: Abnormal   Collection Time: 10/14/23  5:11 AM  Result Value Ref Range   Phosphorus 2.2 (L) 2.5 - 4.6 mg/dL    Comment: Performed at [facility], [address] [place], [place] [address]  CBC     Status: Abnormal   Collection Time: 10/15/23  4:39 AM  Result Value Ref Range   WBC 17.3 (H) 4.0 - 10.5 K/uL   RBC 2.11 (L) 3.87 - 5.11 MIL/uL   Hemoglobin 6.5 (LL) 12.0 - 15.0 g/dL    Comment: REPEATED TO VERIFY THIS CRITICAL RESULT HAS VERIFIED AND BEEN CALLED TO ANGELA STROUD , RN BY MEGAN HAYES ON 03 30 2025 AT 0532, AND HAS BEEN READ BACK. CRITICAL RESULT VERIFIED    HCT 19.6 (L) 36.0 - 46.0 %   MCV 92.9 80.0 - 100.0 fL   MCH 30.8 26.0 - 34.0 pg   MCHC 33.2 30.0 - 36.0 g/dL   RDW 03.4 74.2 - 59.5 %   Platelets 448 (H) 150 - 400 K/uL   nRBC 0.0 0.0 - 0.2 %    Comment: Performed at [facility], [address] [place], [place] [address]  Basic metabolic panel with GFR     Status: Abnormal   Collection Time: 10/15/23  4:39 AM  Result Value Ref Range   Sodium 134 (L) 135 - 145 mmol/L   Potassium 3.8  3.5 - 5.1 mmol/L   Chloride 101 98 - 111 mmol/L   CO2 26 22 - 32 mmol/L   Glucose, Bld 114 (H) 70 - 99 mg/dL    Comment: Glucose reference range applies only to samples taken after fasting for at least 8 hours.   BUN 36 (H) 8 - 23  mg/dL   Creatinine, Ser 8.29 0.44 - 1.00 mg/dL   Calcium 7.5 (L) 8.9 - 10.3 mg/dL   GFR, Estimated >56 >21 mL/min    Comment: (NOTE) Calculated using the CKD-EPI Creatinine Equation (2021)    Anion gap 7 5 - 15    Comment: Performed at [facility], [address] [place], [place] [address]  Magnesium     Status: None   Collection Time: 10/15/23  4:39 AM  Result Value Ref Range   Magnesium 1.9 1.7 - 2.4 mg/dL    Comment: Performed at [facility], [address] [place], [place] [address]  Phosphorus     Status: Abnormal   Collection Time: 10/15/23  4:39 AM  Result Value Ref Range   Phosphorus 1.5 (L) 2.5 - 4.6 mg/dL    Comment: Performed at [facility], [address], [place], [place] [address]  ABO/Rh     Status: None   Collection Time: 10/15/23  4:39 AM  Result Value Ref Range   ABO/RH(D)      A POS Performed at [facility], [address] [place], [place] [address]   Hemoglobin and hematocrit, blood     Status: Abnormal   Collection Time: 10/15/23 11:59 AM  Result Value Ref Range   Hemoglobin 5.3 (LL) 12.0 - 15.0 g/dL    Comment: This critical result has verified and been called to ROSE, B by LINDSAY,CELESTE on 03 30 2025 at 1237, and has been read back.    HCT 16.0 (L) 36.0 - 46.0 %    Comment: Performed at [facility], [address], [place], [place] [address]  Type and screen [facility]  [facility]ITAL     Status: None (Preliminary result)   Collection Time: 10/15/23 11:59 AM  Result Value Ref Range   ABO/RH(D) A POS    Antibody Screen NEG    Sample Expiration      10/18/2023,2359 Performed at [facility], [address] [place], [place] [address]    Unit Number [id number]    Blood Component Type RED CELLS,LR    Unit division 00    Status of Unit ALLOCATED    Transfusion Status OK TO TRANSFUSE    Crossmatch Result Compatible    Unit Number [id number]    Blood Component Type RED CELLS,LR    Unit division 00    Status of Unit ALLOCATED    Transfusion Status OK TO TRANSFUSE    Crossmatch Result Compatible   Reticulocytes     Status: Abnormal   Collection Time: 10/15/23 11:59 AM  Result Value Ref Range   Retic Ct Pct 1.1 0.4 - 3.1 %   RBC. 1.70 (L) 3.87 - 5.11 MIL/uL   Retic Count, Absolute 18.5 (L) 19.0 - 186.0 K/uL   Immature Retic Fract 19.3 (H) 2.3 - 15.9 %    Comment: Performed at [facility], [address] [place], [place] [address]  Vitamin B12     Status: None   Collection Time: 10/15/23 12:00 PM  Result Value Ref Range   Vitamin B-12 549 180 - 914 pg/mL    Comment: (NOTE) This assay is not validated for testing neonatal or myeloproliferative syndrome specimens for Vitamin B12 levels. Performed at [facility], [address] [place], [place] [address]   Folate     Status: Abnormal   Collection Time: 10/15/23 12:00 PM  Result Value Ref Range  Folate 5.2 (L) >5.9 ng/mL    Comment: Performed at [facility], [address], [place], [place] [address]  Iron and TIBC     Status: Abnormal   Collection Time: 10/15/23 12:00 PM  Result Value Ref Range   Iron 59 28 - 170 ug/dL   TIBC 604 (L) 540 - 981 ug/dL   Saturation Ratios 39 (H) 10.4 - 31.8 %   UIBC 92 ug/dL    Comment: Performed at [facility], [address], [place], [place] [address]  Ferritin     Status: None   Collection Time: 10/15/23 12:00 PM  Result Value Ref Range   Ferritin 89 11 - 307 ng/mL    Comment: Performed at [facility], [address], [place], [place] [address]  Prepare RBC (crossmatch)     Status:  None   Collection Time: 10/15/23  1:46 PM  Result Value Ref Range   Order Confirmation      ORDER PROCESSED BY BLOOD BANK Performed at [facility], [address], [place], [place] [address]    Review of Systems  Constitutional:  Positive for activity change and fatigue. Negative for chills, diaphoresis and unexpected weight change.  HENT: Negative.    Eyes: Negative.   Respiratory: Negative.    Cardiovascular: Negative.   Gastrointestinal:  Positive for blood in stool. Negative for abdominal distention, abdominal pain, anal bleeding, constipation, diarrhea and nausea.  Endocrine: Negative.   Genitourinary: Negative.   Musculoskeletal:  Positive for arthralgias.  Allergic/Immunologic: Negative.   Neurological: Negative.   Hematological: Negative.   Psychiatric/Behavioral:  The Susan Fletcher is nervous/anxious.    Blood pressure (!) 97/57, pulse (!) 119, temperature 99.6 F (37.6 C), temperature source Oral, resp. rate 17, height 5\' 1"  (1.549 m), weight 40.6 kg, SpO2 99%. Physical Exam Constitutional:      General: Susan Fletcher is not in acute distress.    Appearance: Susan Fletcher is ill-appearing. Susan Fletcher is not toxic-appearing or diaphoretic.  HENT:     Head: Normocephalic and atraumatic.  Eyes:     Pupils: Pupils are equal, round, and reactive to light.  Cardiovascular:     Rate and Rhythm: Normal rate and regular rhythm.  Pulmonary:     Effort: Pulmonary effort is normal.     Breath sounds: Normal breath sounds.  Abdominal:     General: Bowel sounds are normal.     Palpations: Abdomen is soft.     Tenderness: There is no abdominal tenderness.  Skin:    General: Skin is warm and dry.  Neurological:     General: No focal deficit present.     Mental Status: Susan Fletcher is alert and oriented to person, place, and time.   Assessment/Plan: 1) Severe sigmoid diverticulitis with multiple pericolonic abscesses with additional concern for partial colonic obstruction due to the  inflammation-abnormal CT scan-on Zosyn. 2) Severe anemia with melenic stools-plans are to do EGD tomorrow. 3) Hyponatremia, hypokalemia and hypophosphatemia-monitor and replaced. Charna Elizabeth 10/15/2023, 3:08 PM

## 2023-10-15 NOTE — Progress Notes (Addendum)
 PROGRESS NOTE    Susan Fletcher  ZOX:[id number] DOB: 05-Sep-1945 DOA: 10/12/2023 PCP: Patient, No Pcp Per    Brief Narrative:   Susan Fletcher is a 78 y.o. female with  no known significant medical history who presented to the ED for evaluation of abdominal pain. Patient reports about 3 weeks ago she woke from sleep around midnight with severe lower abdominal pain.  Pain felt like "knives stabbing" her in the abdomen.  This intense pain lasted about 2 days before she had some improvement.  She did not seek medical attention at that time.  She says since then she has had on and off episodes of recurrent severe abdominal pain although she has had some constant baseline pain.   For the last couple days she developed severe pain again.  She has had associated chills and diaphoresis.  She reports low appetite and significant decreased oral intake.  She says she has not had any bowel movements for quite some time.  She says she is not passing flatus.   She has not had a previous colonoscopy.  She denies any prior abdominal surgeries.  She does not take any medications except for occasional Excedrin for headache.   In the ED, BP 120/65, pulse 98, RR 17, temp 98.2 F, SpO2 100% on room air.  WBC 25.8, hemoglobin 10.7, platelets 530,000, sodium 120, potassium 3.1, bicarb 22, BUN 11, creatinine 0.55, serum glucose 102, LFTs within normal limits, lipase 25.  Lactic acid 1.4. UA showed 80 ketones, negative nitrites, small leukocytes, 0-5 RBCs and WBCs, no bacteria.  Blood cultures in process. CT abdomen/pelvis with contrast showed severe sigmoid diverticulitis with multiple pericolonic abscesses.  Largest measures 6.3 x 2.8 cm anteriorly in the pelvis.  Another measured 3.6 x 3.1 cm posteriorly.  Mild dilatation of more proximal colon is noted suggesting partial obstruction secondary to this inflammation.  Hepatic steatosis and mild cholelithiasis also noted.   Patient was given 1 L LR, IV Zosyn, IV K 10 mEq x 4.   EDP spoke with general surgery (Dr. Maisie Fus) who recommended medical admission and will see in consultation today.  The hospitalist service was consulted to admit.  Assessment & Plan:   Concern for upper GI bleed Hemoglobin on admission, 10.7 trended down to 9.4 with acute drop of 6.5 followed by 5.3 today.  BUN also elevated.  Not on anticoagulants, antiplatelets at baseline and not on heparin/Lovenox for DVT prophylaxis.  Has been taking a few doses of IV Toradol for pain.  Anemia panel with findings of slightly low folate. -- GI consulted -- Hgb 10.7>9.8>9.4>6.5>5.3 -- Transfuse 2 unit PRBCs -- Repeat hemoglobin 2 hours following transfusion; followed by every 6 hours thereafter -- Protonix 80 mg IV x 1 followed by Protonix 40 mg IV every 12 hours -- Folate 1 mg p.o. daily -- CT angiogram GI bleeding study: Pending -- Clear liquid diet, n.p.o. after midnight -- Transfer to progressive unit, placed on telemetry  Severe sigmoid diverticulitis with multiple pericolonic abscesses: Patient presenting with 3-week history of abdominal pain with acute worsening.  CT abdomen/pelvis with severe sigmoid diverticulitis with multiple pericolonic abscesses largest measuring 6.3 x 2.8 cm.  Additionally there is concern for associated partial colonic obstruction due to the inflammation.  Interventional radiology was consulted and patient underwent drain placement on 10/13/2023 -- General Surgery and IR following, appreciate assistance -- WBC 25.8>30.9>23.5>17.3 -- Blood cultures x 2: No growth x 3 days -- Operative/drain culture: + Klebsiella pneumonia, susceptibilities pending -- Zosyn 3.375  g IV every 8 hours -- Regular diet -- Pain control, antiemetics, supportive care -- Monitor drain output, will need outpatient follow-up with interventional radiology/CCS for further management --Will need outpatient follow-up with gastroenterology for consideration of colonoscopy in 6-8 weeks -- CBC  daily  Hypokalemia: Hypophosphatemia Hypomagnesemia Potassium 3.8, phosphorus 1.5, magnesium 1.9; will continue replacement of phosphorus --BMP daily with magnesium, phosphorus   Hyponatremia: Mild and secondary to volume depletion.   -- Na 128>127>132>134 -- Discontinue IV fluids -- BMP daily    DVT prophylaxis: SCDs Start: 10/12/23 1939    Code Status: Full Code Family Communication: No family present at bedside this morning  Disposition Plan:  Level of care: Med-Surg Status is: Inpatient Remains inpatient appropriate because: IV antibiotics, pending drain/operative culture susceptibilities, hemoglobin dropped to 6.5>5.3; transfusing PRBCs    Consultants:  General surgery Interventional radiology Gastroenterology, Dr. Loreta Ave  Procedures:  CT-guided drain placement by IR 3/28  Antimicrobials:  Zosyn 3/27>>   Subjective: Patient seen examined bedside, lying in bed.  Feels well, abdominal pain resolved.  Drain in place, understands how to flush.  Hemoglobin this morning 6.5, no signs of bleeding or symptoms to suggest blood loss.  Awaiting repeat hemoglobin.  Patient hesitant regarding blood transfusion.  Will check anemia panel as well as well as type and screen if needed.  Remains on IV Zosyn awaiting operative/drain culture/sensitivities for Klebsiella pneumonia.  No other specific complaints, questions or concerns at this time.  Denies headache, no visual changes, no chest pain, no palpitations, no fever/chills/night sweats, no nausea/vomiting/diarrhea, no focal weakness, no fatigue, no paresthesia.  No acute events overnight per nurse staff.  Objective: Vitals:   10/14/23 1908 10/15/23 0500 10/15/23 0654 10/15/23 0959  BP: 120/71  (!) 91/55 (!) 91/58  Pulse: (!) 108 (!) 109 (!) 105 (!) 113  Resp: 17 17  16   Temp: 99.3 F (37.4 C) 98 F (36.7 C)  98.3 F (36.8 C)  TempSrc: Oral Oral  Oral  SpO2: 97% 99%  100%  Weight:      Height:        Intake/Output  Summary (Last 24 hours) at 10/15/2023 1143 Last data filed at 10/15/2023 0700 Gross per 24 hour  Intake 788.58 ml  Output 10 ml  Net 778.58 ml   Filed Weights   10/12/23 1314  Weight: 40.6 kg    Examination:  Physical Exam: GEN: NAD, alert and oriented x 3, thin/elderly in appearance HEENT: NCAT, PERRL, EOMI, sclera clear, MMM PULM: CTAB w/o wheezes/crackles, normal respiratory effort, on room air CV: RRR w/o M/G/R GI: abd soft, NTND, + BS; drain noted MSK: no peripheral edema, muscle strength globally intact 5/5 bilateral upper/lower extremities NEURO: CN II-XII intact, no focal deficits, sensation to light touch intact PSYCH: normal mood/affect Integumentary: dry/intact, no rashes or wounds    Data Reviewed: I have personally reviewed following labs and imaging studies  CBC: Recent Labs  Lab 10/12/23 1351 10/13/23 0348 10/14/23 0511 10/15/23 0439  WBC 25.8* 30.9* 23.5* 17.3*  HGB 10.7* 9.8* 9.4* 6.5*  HCT 31.2* 29.1* 28.3* 19.6*  MCV 92.9 91.8 94.6 92.9  PLT 530* 488* 487* 448*   Basic Metabolic Panel: Recent Labs  Lab 10/12/23 1351 10/13/23 0348 10/14/23 0511 10/15/23 0439  NA 128* 127* 132* 134*  K 3.1* 3.1* 3.4* 3.8  CL 93* 94* 98 101  CO2 22 18* 20* 26  GLUCOSE 102* 83 99 114*  BUN 11 7* 12 36*  CREATININE 0.55 0.41* 0.50 0.47  CALCIUM 8.1* 7.7* 7.8* 7.5*  MG  --   --  1.7 1.9  PHOS  --   --  2.2* 1.5*   GFR: Estimated Creatinine Clearance: 37.1 mL/min (by C-G formula based on SCr of 0.47 mg/dL). Liver Function Tests: Recent Labs  Lab 10/12/23 1351  AST 16  ALT 13  ALKPHOS 74  BILITOT 0.7  PROT 5.9*  ALBUMIN 2.3*   Recent Labs  Lab 10/12/23 1351  LIPASE 25   No results for input(s): "AMMONIA" in the last 168 hours. Coagulation Profile: Recent Labs  Lab 10/13/23 0828  INR 1.3*   Cardiac Enzymes: No results for input(s): "CKTOTAL", "CKMB", "CKMBINDEX", "TROPONINI" in the last 168 hours. BNP (last 3 results) No results for  input(s): "PROBNP" in the last 8760 hours. HbA1C: No results for input(s): "HGBA1C" in the last 72 hours. CBG: No results for input(s): "GLUCAP" in the last 168 hours. Lipid Profile: No results for input(s): "CHOL", "HDL", "LDLCALC", "TRIG", "CHOLHDL", "LDLDIRECT" in the last 72 hours. Thyroid Function Tests: No results for input(s): "TSH", "T4TOTAL", "FREET4", "T3FREE", "THYROIDAB" in the last 72 hours. Anemia Panel: No results for input(s): "VITAMINB12", "FOLATE", "FERRITIN", "TIBC", "IRON", "RETICCTPCT" in the last 72 hours. Sepsis Labs: Recent Labs  Lab 10/12/23 1643 10/12/23 2107  LATICACIDVEN 1.4 0.9    Recent Results (from the past 240 hours)  Blood culture (routine x 2)     Status: None (Preliminary result)   Collection Time: 10/12/23  4:40 PM   Specimen: BLOOD  Result Value Ref Range Status   Specimen Description   Final    BLOOD SITE NOT SPECIFIED Performed at [facility], [address], [place], [place] [address]    Special Requests   Final    BOTTLES DRAWN AEROBIC AND ANAEROBIC Blood Culture results may not be optimal due to an inadequate volume of blood received in culture bottles Performed at [facility], [address] [place], [place] [address]    Culture   Final    NO GROWTH 3 DAYS Performed at [facility], [address] [place], [place] [address]    Report Status PENDING  Incomplete  Blood culture (routine x 2)     Status: None (Preliminary result)   Collection Time: 10/12/23  4:43 PM   Specimen: BLOOD  Result Value Ref Range Status   Specimen Description   Final    BLOOD SITE NOT SPECIFIED Performed at [facility], [address], [place], [place] [address]    Special Requests   Final    BOTTLES DRAWN AEROBIC AND ANAEROBIC Blood Culture results may not be optimal due to an inadequate volume of blood received in culture bottles Performed at [facility], [address], [place], [place] [address]    Culture   Final    NO GROWTH 3 DAYS Performed at [facility], [address] [place], [place] [address]    Report Status PENDING  Incomplete  Aerobic/Anaerobic Culture w Gram Stain (surgical/deep wound)     Status: None (Preliminary result)   Collection Time: 10/13/23  2:31 PM   Specimen: Abscess  Result Value Ref Range Status   Specimen Description   Final    ABSCESS Performed at [facility], [address], [place], [place] [address]    Special Requests   Final    NONE Performed at [facility], [address], [place], [place] [address]  Gram Stain   Final    ABUNDANT WBC PRESENT, PREDOMINANTLY PMN FEW GRAM NEGATIVE RODS Performed at [facility], [address], [place], [place] [address]    Culture RARE KLEBSIELLA PNEUMONIAE  Final   Report Status PENDING  Incomplete   Organism ID, Bacteria KLEBSIELLA PNEUMONIAE  Final      Susceptibility   Klebsiella pneumoniae - MIC*    AMPICILLIN >=32 RESISTANT Resistant     CEFEPIME <=0.12 SENSITIVE Sensitive     CEFTAZIDIME <=1 SENSITIVE Sensitive     CEFTRIAXONE <=0.25 SENSITIVE Sensitive     CIPROFLOXACIN <=0.25 SENSITIVE Sensitive     GENTAMICIN <=1 SENSITIVE Sensitive     IMIPENEM <=0.25 SENSITIVE Sensitive     TRIMETH/SULFA <=20 SENSITIVE Sensitive     AMPICILLIN/SULBACTAM 4 SENSITIVE Sensitive     PIP/TAZO <=4 SENSITIVE Sensitive ug/mL    * RARE KLEBSIELLA PNEUMONIAE         Radiology Studies: CT GUIDED PERITONEAL/RETROPERITONEAL FLUID DRAIN BY PERC CATH Result Date: 10/13/2023 INDICATION: 78 year old female with perforated diverticulitis and perirectal abscess. EXAM: CT-guided drain placement TECHNIQUE: Multidetector CT imaging of the low abdomen/pelvis was performed following the standard protocol without IV contrast. RADIATION DOSE REDUCTION: This exam was performed according to the departmental dose-optimization program  which includes automated exposure control, adjustment of the mA and/or kV according to patient size and/or use of iterative reconstruction technique. MEDICATIONS: The patient is currently admitted to the hospital and receiving intravenous antibiotics. The antibiotics were administered within an appropriate time frame prior to the initiation of the procedure. ANESTHESIA/SEDATION: Moderate (conscious) sedation was employed during this procedure. A total of Versed 2 mg and Fentanyl 50 mcg was administered intravenously by the radiology nurse. Total intra-service moderate Sedation Time: 15 minutes. The patient's level of consciousness and vital signs were monitored continuously by radiology nursing throughout the procedure under my direct supervision. COMPLICATIONS: None immediate. PROCEDURE: Informed written consent was obtained from the patient after a thorough discussion of the procedural risks, benefits and alternatives. All questions were addressed. Maximal Sterile Barrier Technique was utilized including caps, mask, sterile gowns, sterile gloves, sterile drape, hand hygiene and skin antiseptic. A timeout was performed prior to the initiation of the procedure. Axial CT imaging was performed. The small abscess collection anterior to the sacrum and just superior to the rectum was successfully localized. A suitable skin entry site was selected and marked. Local anesthesia was attained by infiltration with 1% lidocaine. A small dermatotomy was made. Utilizing intermittent CT guidance, an 18 gauge trocar needle was carefully advanced along a right parasacral approach into the fluid collection. A 0.035 wire was then coiled in the fluid collection. Percutaneous tract was dilated to 10 [place]. A skater 10 [place] all-purpose drainage catheter was advanced over the wire and formed. Aspiration yields 7 mL of frankly purulent fluid. Samples were sent for Gram stain and culture. The drainage catheter was gently flushed,  connected to JP bulb suction and then secured to the skin with 0 Prolene suture. Follow-up CT imaging demonstrates a well-positioned drainage catheter with no evidence of complication. IMPRESSION: Successful placement of 10 French drainage catheter via a right transgluteal approach into the perirectal abscess. Electronically Signed   By: Malachy Moan M.D.   On: 10/13/2023 14:30        Scheduled Meds:  sodium chloride flush  5 mL Intracatheter Q8H   Continuous Infusions:  piperacillin-tazobactam (ZOSYN)  IV 3.375 g (10/15/23 1048)   sodium PHOSPHATE IVPB (in mmol) 30 mmol (  10/15/23 1049)     LOS: 3 days    Time spent: 48 minutes spent on 10/15/2023 caring for this patient face-to-face including chart review, ordering labs/tests, documenting, discussion with nursing staff, consultants, updating family and interview/physical exam    Alvira Philips [place], DO [facility] Available via Epic secure chat 7am-7pm After these hours, please refer to coverage provider listed on [email or web address] 10/15/2023, 11:43 AM

## 2023-10-15 NOTE — Progress Notes (Signed)
 Report given to [facility] on [address], pt to transfer to 1410 for continued medical treatment to include cardiac monitoring

## 2023-10-15 NOTE — Progress Notes (Signed)
 Notified MD critical hemoglobin 5.3

## 2023-10-15 NOTE — Plan of Care (Signed)

## 2023-10-16 ENCOUNTER — Inpatient Hospital Stay (HOSPITAL_COMMUNITY): Admitting: Anesthesiology

## 2023-10-16 ENCOUNTER — Encounter (HOSPITAL_COMMUNITY)
Admission: EM | Disposition: A | Discharge status: Home / Self Care | Payer: Self-pay | Source: Home / Self Care | Attending: Internal Medicine

## 2023-10-16 ENCOUNTER — Encounter (HOSPITAL_COMMUNITY): Payer: Self-pay | Admitting: Internal Medicine

## 2023-10-16 DIAGNOSIS — D62 Acute posthemorrhagic anemia: Secondary | ICD-10-CM | POA: Diagnosis not present

## 2023-10-16 DIAGNOSIS — K5720 Diverticulitis of large intestine with perforation and abscess without bleeding: Secondary | ICD-10-CM | POA: Diagnosis not present

## 2023-10-16 DIAGNOSIS — K922 Gastrointestinal hemorrhage, unspecified: Secondary | ICD-10-CM

## 2023-10-16 HISTORY — PX: ESOPHAGOGASTRODUODENOSCOPY: SHX5428

## 2023-10-16 LAB — BPAM RBC
Blood Product Expiration Date: 202504272359
Blood Product Expiration Date: 202504272359
ISSUE DATE / TIME: 202503301640
ISSUE DATE / TIME: 202503301958
Unit Type and Rh: 6200
Unit Type and Rh: 6200

## 2023-10-16 LAB — TYPE AND SCREEN
ABO/RH(D): A POS
Antibody Screen: NEGATIVE
Unit division: 0
Unit division: 0

## 2023-10-16 LAB — HEMOGLOBIN AND HEMATOCRIT, BLOOD
HCT: 24.8 % — ABNORMAL LOW (ref 36.0–46.0)
HCT: 26.1 % — ABNORMAL LOW (ref 36.0–46.0)
HCT: 27.4 % — ABNORMAL LOW (ref 36.0–46.0)
Hemoglobin: 8.7 g/dL — ABNORMAL LOW (ref 12.0–15.0)
Hemoglobin: 8.9 g/dL — ABNORMAL LOW (ref 12.0–15.0)
Hemoglobin: 9.3 g/dL — ABNORMAL LOW (ref 12.0–15.0)

## 2023-10-16 LAB — CBC
HCT: 25.3 % — ABNORMAL LOW (ref 36.0–46.0)
Hemoglobin: 8.8 g/dL — ABNORMAL LOW (ref 12.0–15.0)
MCH: 30.6 pg (ref 26.0–34.0)
MCHC: 34.8 g/dL (ref 30.0–36.0)
MCV: 87.8 fL (ref 80.0–100.0)
Platelets: 343 10*3/uL (ref 150–400)
RBC: 2.88 MIL/uL — ABNORMAL LOW (ref 3.87–5.11)
RDW: 14 % (ref 11.5–15.5)
WBC: 20.6 10*3/uL — ABNORMAL HIGH (ref 4.0–10.5)
nRBC: 0 % (ref 0.0–0.2)

## 2023-10-16 LAB — BASIC METABOLIC PANEL WITH GFR
Anion gap: 9 (ref 5–15)
BUN: 21 mg/dL (ref 8–23)
CO2: 22 mmol/L (ref 22–32)
Calcium: 7.1 mg/dL — ABNORMAL LOW (ref 8.9–10.3)
Chloride: 102 mmol/L (ref 98–111)
Creatinine, Ser: 0.38 mg/dL — ABNORMAL LOW (ref 0.44–1.00)
GFR, Estimated: >60 mL/min (ref >60)
Glucose, Bld: 112 mg/dL — ABNORMAL HIGH (ref 70–99)
Potassium: 3.2 mmol/L — ABNORMAL LOW (ref 3.5–5.1)
Sodium: 133 mmol/L — ABNORMAL LOW (ref 135–145)

## 2023-10-16 SURGERY — EGD (ESOPHAGOGASTRODUODENOSCOPY)
Anesthesia: Monitor Anesthesia Care

## 2023-10-16 MED ORDER — FENTANYL CITRATE (PF) 100 MCG/2ML IJ SOLN
INTRAMUSCULAR | Status: AC
  Filled 2023-10-16: qty 2

## 2023-10-16 MED ORDER — POTASSIUM CHLORIDE 10 MEQ/100ML IV SOLN
10.0000 meq | INTRAVENOUS | Status: AC
  Administered 2023-10-16 (×4): 10 meq via INTRAVENOUS
  Filled 2023-10-16 (×4): qty 100

## 2023-10-16 MED ORDER — FENTANYL CITRATE (PF) 100 MCG/2ML IJ SOLN
INTRAMUSCULAR | Status: DC | PRN
  Administered 2023-10-16: 25 ug via INTRAVENOUS

## 2023-10-16 MED ORDER — SODIUM CHLORIDE 0.9 % IV SOLN
INTRAVENOUS | Status: DC | PRN
Start: 2023-10-16 — End: 2023-10-16

## 2023-10-16 MED ORDER — PROPOFOL 500 MG/50ML IV EMUL
INTRAVENOUS | Status: DC | PRN
  Administered 2023-10-16: 75 ug/kg/min via INTRAVENOUS

## 2023-10-16 MED ORDER — LIDOCAINE HCL (CARDIAC) PF 100 MG/5ML IV SOSY
PREFILLED_SYRINGE | INTRAVENOUS | Status: DC | PRN
  Administered 2023-10-16: 100 mg via INTRATRACHEAL

## 2023-10-16 MED ORDER — PEG 3350-KCL-NA BICARB-NACL 420 G PO SOLR
4000.0000 mL | Freq: Once | ORAL | Status: AC
  Administered 2023-10-16: 4000 mL via ORAL

## 2023-10-16 MED ORDER — SODIUM CHLORIDE 0.9 % IV SOLN
3.0000 g | Freq: Four times a day (QID) | INTRAVENOUS | Status: DC
  Administered 2023-10-16 – 2023-10-19 (×11): 3 g via INTRAVENOUS
  Filled 2023-10-16 (×13): qty 8

## 2023-10-16 NOTE — Anesthesia Postprocedure Evaluation (Signed)
 Anesthesia Post Note  Patient: Kennia Vanvorst  Procedure(s) Performed: EGD (ESOPHAGOGASTRODUODENOSCOPY)     Patient location during evaluation: PACU Anesthesia Type: MAC Level of consciousness: awake Pain management: pain level controlled Vital Signs Assessment: post-procedure vital signs reviewed and stable Respiratory status: spontaneous breathing, nonlabored ventilation and respiratory function stable Cardiovascular status: stable and blood pressure returned to baseline Postop Assessment: no apparent nausea or vomiting Anesthetic complications: no   No notable events documented.  Last Vitals:  Vitals:   10/16/23 0900 10/16/23 1357  BP: 100/63 117/65  Pulse: 83 88  Resp: 16 20  Temp: 36.7 C (!) 36.3 C  SpO2: 98% 95%    Last Pain:  Vitals:   10/16/23 1357  TempSrc: Temporal  PainSc: 0-No pain                 Linton Rump

## 2023-10-16 NOTE — Transfer of Care (Signed)
 Immediate Anesthesia Transfer of Care Note  Patient: Susan Fletcher  Procedure(s) Performed: EGD (ESOPHAGOGASTRODUODENOSCOPY)  Patient Location: Endoscopy Unit  Anesthesia Type:MAC  Level of Consciousness: awake, alert , and oriented  Airway & Oxygen Therapy: Patient Spontanous Breathing  Post-op Assessment: Report given to RN and Post -op Vital signs reviewed and stable  Post vital signs: Reviewed and stable  Last Vitals:  Vitals Value Taken Time  BP 93/58 10/16/23 1450  Temp    Pulse 85 10/16/23 1453  Resp 20 10/16/23 1453  SpO2 96 % 10/16/23 1453  Vitals shown include unfiled device data.  Last Pain:  Vitals:   10/16/23 1357  TempSrc: Temporal  PainSc: 0-No pain         Complications: No notable events documented.

## 2023-10-16 NOTE — Interval H&P Note (Signed)
 History and Physical Interval Note:  10/16/2023 2:24 PM  Susan Fletcher  has presented today for surgery, with the diagnosis of Anemia with melena.  The various methods of treatment have been discussed with the patient and family. After consideration of risks, benefits and other options for treatment, the patient has consented to  Procedure(s) with comments: EGD (ESOPHAGOGASTRODUODENOSCOPY) (N/A) - Anemia with melena as a surgical intervention.  The patient's history has been reviewed, patient examined, no change in status, stable for surgery.  I have reviewed the patient's chart and labs.  Questions were answered to the patient's satisfaction.     Susan Fletcher

## 2023-10-16 NOTE — Progress Notes (Addendum)
 PROGRESS NOTE    Divinity Kyler  ZOX:[id number] DOB: [birth year] DOA: 10/12/2023 PCP: Patient, No Pcp Per    Brief Narrative:   Jennalynn Rivard is a 78 y.o. female with  no known significant medical history who presented to the ED for evaluation of abdominal pain. Patient reports about 3 weeks ago she woke from sleep around midnight with severe lower abdominal pain.  Pain felt like "knives stabbing" her in the abdomen.  This intense pain lasted about 2 days before she had some improvement.  She did not seek medical attention at that time.  She says since then she has had on and off episodes of recurrent severe abdominal pain although she has had some constant baseline pain.   For the last couple days she developed severe pain again.  She has had associated chills and diaphoresis.  She reports low appetite and significant decreased oral intake.  She says she has not had any bowel movements for quite some time.  She says she is not passing flatus.   She has not had a previous colonoscopy.  She denies any prior abdominal surgeries.  She does not take any medications except for occasional Excedrin for headache.   In the ED, BP 120/65, pulse 98, RR 17, temp 98.2 F, SpO2 100% on room air.  WBC 25.8, hemoglobin 10.7, platelets 530,000, sodium 120, potassium 3.1, bicarb 22, BUN 11, creatinine 0.55, serum glucose 102, LFTs within normal limits, lipase 25.  Lactic acid 1.4. UA showed 80 ketones, negative nitrites, small leukocytes, 0-5 RBCs and WBCs, no bacteria.  Blood cultures in process. CT abdomen/pelvis with contrast showed severe sigmoid diverticulitis with multiple pericolonic abscesses.  Largest measures 6.3 x 2.8 cm anteriorly in the pelvis.  Another measured 3.6 x 3.1 cm posteriorly.  Mild dilatation of more proximal colon is noted suggesting partial obstruction secondary to this inflammation.  Hepatic steatosis and mild cholelithiasis also noted.   Patient was given 1 L LR, IV Zosyn, IV K 10 mEq x 4.   EDP spoke with general surgery (Dr. Maisie Fus) who recommended medical admission and will see in consultation today.  The hospitalist service was consulted to admit.  Assessment & Plan:   Concern for upper GI bleed Hemoglobin on admission, 10.7 trended down to 9.4 with acute drop of 6.5 followed by 5.3 today.  BUN also elevated.  Not on anticoagulants, antiplatelets at baseline and not on heparin/Lovenox for DVT prophylaxis.  Has been taking a few doses of IV Toradol for pain.  Anemia panel with findings of slightly low folate.  CTA GI bleeding study on 3/30 with no evidence of active gastrointestinal bleeding. -- GI following, appreciate assistance -- Hgb 10.7>9.8>9.4>6.5>5.3>8.8>8.7 -- Transfused 2 unit PRBCs 3/30 -- Protonix 40 mg IV every 12 hours -- Folate 1 mg p.o. daily -- N.p.o. for planned EGD this afternoon -- Monitor on telemetry  Severe sigmoid diverticulitis with multiple pericolonic abscesses: Patient presenting with 3-week history of abdominal pain with acute worsening.  CT abdomen/pelvis with severe sigmoid diverticulitis with multiple pericolonic abscesses largest measuring 6.3 x 2.8 cm.  Additionally there is concern for associated partial colonic obstruction due to the inflammation.  Interventional radiology was consulted and patient underwent drain placement on 10/13/2023 -- General Surgery and IR following, appreciate assistance -- WBC 25.8>30.9>23.5>17.3>20.6 -- Blood cultures x 2: No growth x 3 days -- Operative/drain culture: + Klebsiella pneumonia, susceptibilities pending -- Zosyn 3.375 g IV every 8 hours>> narrowed to Unasyn today per ID pharmacist -- Regular diet --  Pain control, antiemetics, supportive care -- Monitor drain output, will need outpatient follow-up with interventional radiology/CCS for further management -- Will need outpatient follow-up with gastroenterology for consideration of colonoscopy in 6-8 weeks -- CBC  daily  Hypokalemia: Hypophosphatemia Hypomagnesemia Potassium 3.2 today, will replete. -- BMP, magnesium, phosphorus in the a.m.   Hyponatremia: Mild and secondary to volume depletion.   -- Na 128>127>132>134>133 -- BMP daily    DVT prophylaxis: SCDs Start: 10/12/23 1939    Code Status: Full Code Family Communication: No family present at bedside this morning  Disposition Plan:  Level of care: Progressive Status is: Inpatient Remains inpatient appropriate because: IV antibiotics, pending EGD today    Consultants:  General surgery Interventional radiology Gastroenterology, Dr. Loreta Ave  Procedures:  CT-guided drain placement by IR 3/28  Antimicrobials:  Zosyn 3/27>>   Subjective: Patient seen examined bedside, lying in bed.  Continues to feel well, improved since yesterday after receiving blood transfusion.  Hemoglobin up to 8.8 followed by 8.7 from 5.3 yesterday after receiving 2 units of PRBCs.  Denies any abdominal pain.  Awaiting EGD this afternoon. No other specific complaints, questions or concerns at this time.  Denies headache, no visual changes, no chest pain, no palpitations, no fever/chills/night sweats, no nausea/vomiting/diarrhea, no focal weakness, no fatigue, no paresthesia.  No acute events overnight per nurse staff.  Objective: Vitals:   10/15/23 2251 10/15/23 2313 10/16/23 0538 10/16/23 0900  BP: 98/84 95/64 (!) 96/58 100/63  Pulse: 88 86 81 83  Resp: 14 15 15 16   Temp: 99.2 F (37.3 C) 98.1 F (36.7 C) 99.2 F (37.3 C) 98.1 F (36.7 C)  TempSrc: Oral Oral Oral   SpO2: 100% 98% 97% 98%  Weight:      Height:        Intake/Output Summary (Last 24 hours) at 10/16/2023 1127 Last data filed at 10/16/2023 0555 Gross per 24 hour  Intake 1417.97 ml  Output 10 ml  Net 1407.97 ml   Filed Weights   10/12/23 1314  Weight: 40.6 kg    Examination:  Physical Exam: GEN: NAD, alert and oriented x 3, thin/elderly in appearance HEENT: NCAT, PERRL,  EOMI, sclera clear, MMM PULM: CTAB w/o wheezes/crackles, normal respiratory effort, on room air CV: RRR w/o M/G/R GI: abd soft, NTND, + BS; drain noted MSK: no peripheral edema, muscle strength globally intact 5/5 bilateral upper/lower extremities NEURO: CN II-XII intact, no focal deficits, sensation to light touch intact PSYCH: normal mood/affect Integumentary: dry/intact, no rashes or wounds    Data Reviewed: I have personally reviewed following labs and imaging studies  CBC: Recent Labs  Lab 10/12/23 1351 10/13/23 0348 10/14/23 0511 10/15/23 0439 10/15/23 1159 10/16/23 0117 10/16/23 0711  WBC 25.8* 30.9* 23.5* 17.3*  --  20.6*  --   HGB 10.7* 9.8* 9.4* 6.5* 5.3* 8.8* 8.7*  HCT 31.2* 29.1* 28.3* 19.6* 16.0* 25.3* 24.8*  MCV 92.9 91.8 94.6 92.9  --  87.8  --   PLT 530* 488* 487* 448*  --  343  --    Basic Metabolic Panel: Recent Labs  Lab 10/12/23 1351 10/13/23 0348 10/14/23 0511 10/15/23 0439 10/16/23 0117  NA 128* 127* 132* 134* 133*  K 3.1* 3.1* 3.4* 3.8 3.2*  CL 93* 94* 98 101 102  CO2 22 18* 20* 26 22  GLUCOSE 102* 83 99 114* 112*  BUN 11 7* 12 36* 21  CREATININE 0.55 0.41* 0.50 0.47 0.38*  CALCIUM 8.1* 7.7* 7.8* 7.5* 7.1*  MG  --   --  1.7 1.9  --   PHOS  --   --  2.2* 1.5*  --    GFR: Estimated Creatinine Clearance: 37.1 mL/min (A) (by C-G formula based on SCr of 0.38 mg/dL (L)). Liver Function Tests: Recent Labs  Lab 10/12/23 1351  AST 16  ALT 13  ALKPHOS 74  BILITOT 0.7  PROT 5.9*  ALBUMIN 2.3*   Recent Labs  Lab 10/12/23 1351  LIPASE 25   No results for input(s): "AMMONIA" in the last 168 hours. Coagulation Profile: Recent Labs  Lab 10/13/23 0828  INR 1.3*   Cardiac Enzymes: No results for input(s): "CKTOTAL", "CKMB", "CKMBINDEX", "TROPONINI" in the last 168 hours. BNP (last 3 results) No results for input(s): "PROBNP" in the last 8760 hours. HbA1C: No results for input(s): "HGBA1C" in the last 72 hours. CBG: No results for  input(s): "GLUCAP" in the last 168 hours. Lipid Profile: No results for input(s): "CHOL", "HDL", "LDLCALC", "TRIG", "CHOLHDL", "LDLDIRECT" in the last 72 hours. Thyroid Function Tests: No results for input(s): "TSH", "T4TOTAL", "FREET4", "T3FREE", "THYROIDAB" in the last 72 hours. Anemia Panel: Recent Labs    10/15/23 1159 10/15/23 1200  VITAMINB12  --  549  FOLATE  --  5.2*  FERRITIN  --  89  TIBC  --  151*  IRON  --  59  RETICCTPCT 1.1  --    Sepsis Labs: Recent Labs  Lab 10/12/23 1643 10/12/23 2107  LATICACIDVEN 1.4 0.9    Recent Results (from the past 240 hours)  Blood culture (routine x 2)     Status: None (Preliminary result)   Collection Time: 10/12/23  4:40 PM   Specimen: BLOOD  Result Value Ref Range Status   Specimen Description   Final    BLOOD SITE NOT SPECIFIED Performed at [facility], [address], [place], [place] [address]    Special Requests   Final    BOTTLES DRAWN AEROBIC AND ANAEROBIC Blood Culture results may not be optimal due to an inadequate volume of blood received in culture bottles Performed at [facility], [address], [place], [place] [address]    Culture   Final    NO GROWTH 3 DAYS Performed at [facility], [address], [place], [place] [address]    Report Status PENDING  Incomplete  Blood culture (routine x 2)     Status: None (Preliminary result)   Collection Time: 10/12/23  4:43 PM   Specimen: BLOOD  Result Value Ref Range Status   Specimen Description   Final    BLOOD SITE NOT SPECIFIED Performed at [facility], [address], [place], [place] [address]    Special Requests   Final    BOTTLES DRAWN AEROBIC AND ANAEROBIC Blood Culture results may not be optimal due to an inadequate volume of blood received in culture bottles Performed at [facility], [address] [place], [place] [address]    Culture   Final    NO GROWTH 3  DAYS Performed at [facility], [address] [place], [place] [address]    Report Status PENDING  Incomplete  Aerobic/Anaerobic Culture w Gram Stain (surgical/deep wound)     Status: None (Preliminary result)   Collection Time: 10/13/23  2:31 PM   Specimen: Abscess  Result Value Ref Range Status   Specimen Description   Final    ABSCESS Performed at [facility], [address], [place], [place] [address]  Special Requests   Final    NONE Performed at [facility], [address] [place], [place] [address]    Gram Stain   Final    ABUNDANT WBC PRESENT, PREDOMINANTLY PMN FEW GRAM NEGATIVE RODS    Culture   Final    RARE KLEBSIELLA PNEUMONIAE HOLDING FOR POSSIBLE ANAEROBE Performed at [facility], [address] [place], [place] [address]    Report Status PENDING  Incomplete   Organism ID, Bacteria KLEBSIELLA PNEUMONIAE  Final      Susceptibility   Klebsiella pneumoniae - MIC*    AMPICILLIN >=32 RESISTANT Resistant     CEFEPIME <=0.12 SENSITIVE Sensitive     CEFTAZIDIME <=1 SENSITIVE Sensitive     CEFTRIAXONE <=0.25 SENSITIVE Sensitive     CIPROFLOXACIN <=0.25 SENSITIVE Sensitive     GENTAMICIN <=1 SENSITIVE Sensitive     IMIPENEM <=0.25 SENSITIVE Sensitive     TRIMETH/SULFA <=20 SENSITIVE Sensitive     AMPICILLIN/SULBACTAM 4 SENSITIVE Sensitive     PIP/TAZO <=4 SENSITIVE Sensitive ug/mL    * RARE KLEBSIELLA PNEUMONIAE         Radiology Studies: CT ANGIO GI BLEED Result Date: 10/15/2023 CLINICAL DATA:  Sigmoid diverticulitis with perirectal abscess status post percutaneous drain, concern for gastrointestinal bleeding EXAM: CTA ABDOMEN AND PELVIS WITHOUT AND WITH CONTRAST TECHNIQUE: Multidetector CT imaging of the abdomen and pelvis was performed using the standard protocol during bolus administration of intravenous contrast. Multiplanar reconstructed images and MIPs were obtained and reviewed to evaluate the  vascular anatomy. RADIATION DOSE REDUCTION: This exam was performed according to the departmental dose-optimization program which includes automated exposure control, adjustment of the mA and/or kV according to patient size and/or use of iterative reconstruction technique. CONTRAST:  80mL OMNIPAQUE IOHEXOL 350 MG/ML SOLN COMPARISON:  10/13/2023, 10/12/2023 FINDINGS: VASCULAR Aorta: Normal caliber aorta without aneurysm, dissection, vasculitis or significant stenosis. Diffuse atherosclerosis. Celiac: Patent without evidence of aneurysm, dissection, vasculitis or significant stenosis. SMA: Patent without evidence of aneurysm, dissection, vasculitis or significant stenosis. Renals: Both renal arteries are patent without evidence of aneurysm, dissection, vasculitis, fibromuscular dysplasia or significant stenosis. IMA: Patent without evidence of aneurysm, dissection, vasculitis or significant stenosis. Inflow: Patent without evidence of aneurysm, dissection, vasculitis or significant stenosis. Proximal Outflow: Bilateral common femoral and visualized portions of the superficial and profunda femoral arteries are patent without evidence of aneurysm, dissection, vasculitis or significant stenosis. Veins: No obvious venous abnormality within the limitations of this arterial phase study. Review of the MIP images confirms the above findings. NON-VASCULAR Lower chest: Small bilateral pleural effusions with dependent lower lobe atelectasis. Hepatobiliary: Multiple punctate calcified gallstones without evidence of acute cholecystitis. No biliary duct dilation or evidence of choledocholithiasis. The liver is unremarkable. Pancreas: Unremarkable. No pancreatic ductal dilatation or surrounding inflammatory changes. Spleen: Normal in size without focal abnormality. Adrenals/Urinary Tract: Kidneys enhance normally and symmetrically. No urinary tract calculi or obstruction. Bladder is unremarkable. The adrenals are stable.  Stomach/Bowel: Wall thickening throughout the sigmoid colon again identified consistent with known sigmoid diverticulitis. Interval placement of a percutaneous pigtail drainage catheter anterior to the sacrum, decompressing the presacral fluid collection. There are several other smaller fluid collections within the left mid abdomen, largest measuring 2.1 cm in size, decreased since the previous evaluation suggesting communication with the presacral abscess evacuated by the pigtail drainage catheter. Continued follow-up is recommended. No evidence of high-grade bowel obstruction. Scattered gas fluid levels throughout nondilated loops of small bowel may suggest ileus. High attenuation retained  oral contrast within the colon limits the evaluation for active gastrointestinal bleeding. There is no evidence of intraluminal contrast accumulation to suggest active gastrointestinal bleeding. There is marked mucosal hyperenhancement within the region of sigmoid diverticulitis described above. Lymphatic: No pathologic adenopathy. Reproductive: Uterus is atrophic. IUD again identified unchanged. No adnexal masses. Other: Trace pelvic free fluid. No free intraperitoneal gas. No abdominal wall hernia. Musculoskeletal: No acute or destructive bony abnormalities. Reconstructed images demonstrate no additional findings. IMPRESSION: VASCULAR 1. No evidence of active gastrointestinal bleeding. High attenuation stool/retained contrast within the distal colon does limit evaluation. 2.  Aortic Atherosclerosis (ICD10-I70.0). NON-VASCULAR 1. Sigmoid diverticulitis, with interval decrease in the diverticular abscesses seen on prior study after interval percutaneous pigtail drainage catheter placement. 2. Cholelithiasis without cholecystitis. 3. Small bilateral pleural effusions with dependent lower lobe atelectasis. 4. Scattered gas fluid levels throughout nondilated loops of small bowel, compatible with ileus. No evidence of high-grade  obstruction. 5. Trace pelvic free fluid. Electronically Signed   By: Sharlet Salina M.D.   On: 10/15/2023 19:00        Scheduled Meds:  folic acid  1 mg Oral Daily   pantoprazole (PROTONIX) IV  40 mg Intravenous Q12H   sodium chloride flush  5 mL Intracatheter Q8H   Continuous Infusions:  piperacillin-tazobactam (ZOSYN)  IV 3.375 g (10/16/23 0146)   potassium chloride 10 mEq (10/16/23 1043)     LOS: 4 days    Time spent: 48 minutes spent on 10/16/2023 caring for this patient face-to-face including chart review, ordering labs/tests, documenting, discussion with nursing staff, consultants, updating family and interview/physical exam    Alvira Philips [place], DO [facility] Available via Epic secure chat 7am-7pm After these hours, please refer to coverage provider listed on [email or web address] 10/16/2023, 11:27 AM

## 2023-10-16 NOTE — Progress Notes (Signed)
 Patient ID: Susan Fletcher, female   DOB: 11-07-1945, 78 y.o.   MRN: [id number]    Referring Physician(s): Romie Levee, MD   Supervising Physician: Roanna Banning  Patient Status:  [facility] - In-pt  Chief Complaint:  Diverticular abscess s/p right TG drain placement 10/13/23 in IR (Dr. Archer Asa)  Subjective:  Pt is day 3 post op from right TG drain placement for diverticular abscess. Sitting up in bed today appearing well. No pain relating to drain site. Over the weekend there was concern the drain was not charged or flushed properly. Pt reports flushing it 3 times per day since with no complication. Over the weekend patient had development of dark colored stools and acute drop in hgb down to 5.3 - received 2 units RBCs with hgb up to 8.7 today, being followed by GI today for endoscopy.  Allergies: Patient has no known allergies.  Medications: Prior to Admission medications   Medication Sig Start Date End Date Taking? Authorizing Provider  aspirin-acetaminophen-caffeine (EXCEDRIN MIGRAINE) [phone] MG tablet Take 1 tablet by mouth every 6 (six) hours as needed for headache.   Yes [provider]  ibuprofen (ADVIL) 200 MG tablet Take 200 mg by mouth every 6 (six) hours as needed for mild pain (pain score 1-3).   Yes [provider]     Vital Signs: BP 100/63   Pulse 83   Temp 98.1 F (36.7 C)   Resp 16   Ht 5\' 1"  (1.549 m)   Wt 89 lb 9.6 oz (40.6 kg)   SpO2 98%   BMI 16.93 kg/m   Physical Exam Vitals and nursing note reviewed.  Constitutional:      Appearance: Normal appearance. She is not ill-appearing or toxic-appearing.     Comments: Sitting up in bed, smiling, appears generally well  HENT:     Mouth/Throat:     Mouth: Mucous membranes are moist.     Pharynx: Oropharynx is clear.  Cardiovascular:     Rate and Rhythm: Normal rate and regular rhythm.  Pulmonary:     Effort: Pulmonary effort is normal.     Breath sounds: Normal breath sounds.   Abdominal:     Palpations: Abdomen is soft.     Tenderness: There is no abdominal tenderness.     Comments: Right TG drain to suction bulb. Appears well. I/O notes 10ml output over last day with roughly 5ml purulent drainage seen in bulb at bedside. No external abnormality. No pain. Bulb appears properly charged and drain is patent.  Musculoskeletal:     Right lower leg: No edema.     Left lower leg: No edema.  Skin:    General: Skin is warm and dry.  Neurological:     General: No focal deficit present.     Mental Status: She is alert and oriented to person, place, and time. Mental status is at baseline.     Imaging: CT ANGIO GI BLEED Result Date: 10/15/2023 CLINICAL DATA:  Sigmoid diverticulitis with perirectal abscess status post percutaneous drain, concern for gastrointestinal bleeding EXAM: CTA ABDOMEN AND PELVIS WITHOUT AND WITH CONTRAST TECHNIQUE: Multidetector CT imaging of the abdomen and pelvis was performed using the standard protocol during bolus administration of intravenous contrast. Multiplanar reconstructed images and MIPs were obtained and reviewed to evaluate the vascular anatomy. RADIATION DOSE REDUCTION: This exam was performed according to the departmental dose-optimization program which includes automated exposure control, adjustment of the mA and/or kV according to patient size and/or use of iterative reconstruction technique.  CONTRAST:  80mL OMNIPAQUE IOHEXOL 350 MG/ML SOLN COMPARISON:  10/13/2023, 10/12/2023 FINDINGS: VASCULAR Aorta: Normal caliber aorta without aneurysm, dissection, vasculitis or significant stenosis. Diffuse atherosclerosis. Celiac: Patent without evidence of aneurysm, dissection, vasculitis or significant stenosis. SMA: Patent without evidence of aneurysm, dissection, vasculitis or significant stenosis. Renals: Both renal arteries are patent without evidence of aneurysm, dissection, vasculitis, fibromuscular dysplasia or significant stenosis. IMA: Patent  without evidence of aneurysm, dissection, vasculitis or significant stenosis. Inflow: Patent without evidence of aneurysm, dissection, vasculitis or significant stenosis. Proximal Outflow: Bilateral common femoral and visualized portions of the superficial and profunda femoral arteries are patent without evidence of aneurysm, dissection, vasculitis or significant stenosis. Veins: No obvious venous abnormality within the limitations of this arterial phase study. Review of the MIP images confirms the above findings. NON-VASCULAR Lower chest: Small bilateral pleural effusions with dependent lower lobe atelectasis. Hepatobiliary: Multiple punctate calcified gallstones without evidence of acute cholecystitis. No biliary duct dilation or evidence of choledocholithiasis. The liver is unremarkable. Pancreas: Unremarkable. No pancreatic ductal dilatation or surrounding inflammatory changes. Spleen: Normal in size without focal abnormality. Adrenals/Urinary Tract: Kidneys enhance normally and symmetrically. No urinary tract calculi or obstruction. Bladder is unremarkable. The adrenals are stable. Stomach/Bowel: Wall thickening throughout the sigmoid colon again identified consistent with known sigmoid diverticulitis. Interval placement of a percutaneous pigtail drainage catheter anterior to the sacrum, decompressing the presacral fluid collection. There are several other smaller fluid collections within the left mid abdomen, largest measuring 2.1 cm in size, decreased since the previous evaluation suggesting communication with the presacral abscess evacuated by the pigtail drainage catheter. Continued follow-up is recommended. No evidence of high-grade bowel obstruction. Scattered gas fluid levels throughout nondilated loops of small bowel may suggest ileus. High attenuation retained oral contrast within the colon limits the evaluation for active gastrointestinal bleeding. There is no evidence of intraluminal contrast  accumulation to suggest active gastrointestinal bleeding. There is marked mucosal hyperenhancement within the region of sigmoid diverticulitis described above. Lymphatic: No pathologic adenopathy. Reproductive: Uterus is atrophic. IUD again identified unchanged. No adnexal masses. Other: Trace pelvic free fluid. No free intraperitoneal gas. No abdominal wall hernia. Musculoskeletal: No acute or destructive bony abnormalities. Reconstructed images demonstrate no additional findings. IMPRESSION: VASCULAR 1. No evidence of active gastrointestinal bleeding. High attenuation stool/retained contrast within the distal colon does limit evaluation. 2.  Aortic Atherosclerosis (ICD10-I70.0). NON-VASCULAR 1. Sigmoid diverticulitis, with interval decrease in the diverticular abscesses seen on prior study after interval percutaneous pigtail drainage catheter placement. 2. Cholelithiasis without cholecystitis. 3. Small bilateral pleural effusions with dependent lower lobe atelectasis. 4. Scattered gas fluid levels throughout nondilated loops of small bowel, compatible with ileus. No evidence of high-grade obstruction. 5. Trace pelvic free fluid. Electronically Signed   By: Sharlet Salina M.D.   On: 10/15/2023 19:00   CT GUIDED PERITONEAL/RETROPERITONEAL FLUID DRAIN BY PERC CATH Result Date: 10/13/2023 INDICATION: 78 year old female with perforated diverticulitis and perirectal abscess. EXAM: CT-guided drain placement TECHNIQUE: Multidetector CT imaging of the low abdomen/pelvis was performed following the standard protocol without IV contrast. RADIATION DOSE REDUCTION: This exam was performed according to the departmental dose-optimization program which includes automated exposure control, adjustment of the mA and/or kV according to patient size and/or use of iterative reconstruction technique. MEDICATIONS: The patient is currently admitted to the hospital and receiving intravenous antibiotics. The antibiotics were administered  within an appropriate time frame prior to the initiation of the procedure. ANESTHESIA/SEDATION: Moderate (conscious) sedation was employed during this procedure. A total of Versed 2  mg and Fentanyl 50 mcg was administered intravenously by the radiology nurse. Total intra-service moderate Sedation Time: 15 minutes. The patient's level of consciousness and vital signs were monitored continuously by radiology nursing throughout the procedure under my direct supervision. COMPLICATIONS: None immediate. PROCEDURE: Informed written consent was obtained from the patient after a thorough discussion of the procedural risks, benefits and alternatives. All questions were addressed. Maximal Sterile Barrier Technique was utilized including caps, mask, sterile gowns, sterile gloves, sterile drape, hand hygiene and skin antiseptic. A timeout was performed prior to the initiation of the procedure. Axial CT imaging was performed. The small abscess collection anterior to the sacrum and just superior to the rectum was successfully localized. A suitable skin entry site was selected and marked. Local anesthesia was attained by infiltration with 1% lidocaine. A small dermatotomy was made. Utilizing intermittent CT guidance, an 18 gauge trocar needle was carefully advanced along a right parasacral approach into the fluid collection. A 0.035 wire was then coiled in the fluid collection. Percutaneous tract was dilated to 10 [place]. A skater 10 [place] all-purpose drainage catheter was advanced over the wire and formed. Aspiration yields 7 mL of frankly purulent fluid. Samples were sent for Gram stain and culture. The drainage catheter was gently flushed, connected to JP bulb suction and then secured to the skin with 0 Prolene suture. Follow-up CT imaging demonstrates a well-positioned drainage catheter with no evidence of complication. IMPRESSION: Successful placement of 10 French drainage catheter via a right transgluteal approach into the  perirectal abscess. Electronically Signed   By: Malachy Moan M.D.   On: 10/13/2023 14:30   CT ABDOMEN PELVIS W CONTRAST Result Date: 10/12/2023 CLINICAL DATA:  Acute generalized abdominal pain. EXAM: CT ABDOMEN AND PELVIS WITH CONTRAST TECHNIQUE: Multidetector CT imaging of the abdomen and pelvis was performed using the standard protocol following bolus administration of intravenous contrast. RADIATION DOSE REDUCTION: This exam was performed according to the departmental dose-optimization program which includes automated exposure control, adjustment of the mA and/or kV according to patient size and/or use of iterative reconstruction technique. CONTRAST:  80mL OMNIPAQUE IOHEXOL 300 MG/ML  SOLN COMPARISON:  None Available. FINDINGS: Lower chest: No acute abnormality. Hepatobiliary: Hepatic steatosis is noted. Mild cholelithiasis is noted. No biliary dilatation is noted. Pancreas: Unremarkable. No pancreatic ductal dilatation or surrounding inflammatory changes. Spleen: Normal in size without focal abnormality. Adrenals/Urinary Tract: Adrenal glands are unremarkable. Kidneys are normal, without renal calculi, focal lesion, or hydronephrosis. Bladder is unremarkable. Stomach/Bowel: The stomach is unremarkable. There is no evidence of small bowel dilatation. The appendix is not clearly visualized. There appears to be severe sigmoid diverticulitis present with multiple small pericolonic abscesses. The largest measures 6.3 x 2.8 cm best seen on image number 56 of series 8. Probable 3.6 x 3.1 cm abscess is noted posteriorly in the pelvis. There appears to be mild dilatation of the more proximal colon suggesting partial obstruction secondary to this inflammation. Vascular/Lymphatic: Aortic atherosclerosis. No enlarged abdominal or pelvic lymph nodes. Reproductive: Intrauterine device is noted. Adnexal regions are not well visualized due to previously described inflammation in the pelvis. Other: No definite hernia is  noted. Musculoskeletal: No acute or significant osseous findings. IMPRESSION: Severe sigmoid diverticulitis is noted with multiple pericolonic abscess is present. The largest measures 6.3 x 2.8 cm anteriorly in the pelvis. Another measures 3.6 x 3.1 cm posteriorly. Mild dilatation of more proximal colon is noted suggesting partial obstruction secondary to this inflammation. Hepatic steatosis. Mild cholelithiasis. Aortic Atherosclerosis (ICD10-I70.0). Electronically Signed  By: Lupita Raider M.D.   On: 10/12/2023 17:51    Labs:  CBC: Recent Labs    10/13/23 0348 10/14/23 0511 10/15/23 0439 10/15/23 1159 10/16/23 0117 10/16/23 0711  WBC 30.9* 23.5* 17.3*  --  20.6*  --   HGB 9.8* 9.4* 6.5* 5.3* 8.8* 8.7*  HCT 29.1* 28.3* 19.6* 16.0* 25.3* 24.8*  PLT 488* 487* 448*  --  343  --     COAGS: Recent Labs    10/13/23 0828  INR 1.3*    BMP: Recent Labs    10/13/23 0348 10/14/23 0511 10/15/23 0439 10/16/23 0117  NA 127* 132* 134* 133*  K 3.1* 3.4* 3.8 3.2*  CL 94* 98 101 102  CO2 18* 20* 26 22  GLUCOSE 83 99 114* 112*  BUN 7* 12 36* 21  CALCIUM 7.7* 7.8* 7.5* 7.1*  CREATININE 0.41* 0.50 0.47 0.38*  GFRNONAA >60 >60 >60 >60    LIVER FUNCTION TESTS: Recent Labs    10/12/23 1351  BILITOT 0.7  AST 16  ALT 13  ALKPHOS 74  PROT 5.9*  ALBUMIN 2.3*    Assessment and Plan:  Will continue to monitor drain while patient is in hospital. Pending eval and workup by GI and other associated teams for anemia. Continue TID flushes with 5 cc NS. Please be sure bulb is fully charged after flushing. Record output Q shift. Dressing changes QD or PRN if soiled.  Call IR APP or on call IR MD if difficulty flushing or sudden change in drain output.  Repeat imaging/possible drain injection once output < 10 mL/QD (excluding flush material). Consideration for drain removal if output is < 10 mL/QD (excluding flush material), pending discussion with the providing surgical  service.   Electronically Signed: Katheren Puller, PA-C 10/16/2023, 10:25 AM   I spent a total of 15 Minutes at the the patient's bedside AND on the patient's hospital floor or unit, greater than 50% of which was counseling/coordinating care for diverticulitis with abscess s/p drain placement.

## 2023-10-16 NOTE — Anesthesia Procedure Notes (Signed)
 Procedure Name: MAC Date/Time: 10/16/2023 2:30 PM  Performed by: Micki Riley, CRNAPre-anesthesia Checklist: Patient identified, Emergency Drugs available, Suction available and Patient being monitored Patient Re-evaluated:Patient Re-evaluated prior to induction Oxygen Delivery Method: Simple face mask Preoxygenation: Pre-oxygenation with 100% oxygen Induction Type: IV induction Dental Injury: Teeth and Oropharynx as per pre-operative assessment

## 2023-10-16 NOTE — Progress Notes (Signed)
 Subjective/Chief Complaint: Feeling okay this morning. No abdominal pain today or yesterday with full liquids. Having Bms.   Objective: Vital signs in last 24 hours: Temp:  [98 F (36.7 C)-99.6 F (37.6 C)] 99.2 F (37.3 C) (03/31 0538) Pulse Rate:  [81-123] 81 (03/31 0538) Resp:  [14-24] 15 (03/31 0538) BP: (91-123)/(57-84) 96/58 (03/31 0538) SpO2:  [96 %-100 %] 97 % (03/31 0538) Last BM Date : 10/15/23  Intake/Output from previous day: 03/30 0701 - 03/31 0700 In: 1418 [I.V.:84.5; ZOXWR:604; IV Piggyback:689.5] Out: 10 [Drains:10] Intake/Output this shift: No intake/output data recorded.  Abdomen: Soft nontender without rebound guarding.  Drain noted with scant amount of tan fluid.  No blood.  Lab Results:  Recent Labs    10/15/23 0439 10/15/23 1159 10/16/23 0117 10/16/23 0711  WBC 17.3*  --  20.6*  --   HGB 6.5*   < > 8.8* 8.7*  HCT 19.6*   < > 25.3* 24.8*  PLT 448*  --  343  --    < > = values in this interval not displayed.   BMET Recent Labs    10/15/23 0439 10/16/23 0117  NA 134* 133*  K 3.8 3.2*  CL 101 102  CO2 26 22  GLUCOSE 114* 112*  BUN 36* 21  CREATININE 0.47 0.38*  CALCIUM 7.5* 7.1*   PT/INR No results for input(s): "LABPROT", "INR" in the last 72 hours.  ABG No results for input(s): "PHART", "HCO3" in the last 72 hours.  Invalid input(s): "PCO2", "PO2"  Studies/Results: CT ANGIO GI BLEED Result Date: 10/15/2023 CLINICAL DATA:  Sigmoid diverticulitis with perirectal abscess status post percutaneous drain, concern for gastrointestinal bleeding EXAM: CTA ABDOMEN AND PELVIS WITHOUT AND WITH CONTRAST TECHNIQUE: Multidetector CT imaging of the abdomen and pelvis was performed using the standard protocol during bolus administration of intravenous contrast. Multiplanar reconstructed images and MIPs were obtained and reviewed to evaluate the vascular anatomy. RADIATION DOSE REDUCTION: This exam was performed according to the departmental  dose-optimization program which includes automated exposure control, adjustment of the mA and/or kV according to patient size and/or use of iterative reconstruction technique. CONTRAST:  80mL OMNIPAQUE IOHEXOL 350 MG/ML SOLN COMPARISON:  10/13/2023, 10/12/2023 FINDINGS: VASCULAR Aorta: Normal caliber aorta without aneurysm, dissection, vasculitis or significant stenosis. Diffuse atherosclerosis. Celiac: Patent without evidence of aneurysm, dissection, vasculitis or significant stenosis. SMA: Patent without evidence of aneurysm, dissection, vasculitis or significant stenosis. Renals: Both renal arteries are patent without evidence of aneurysm, dissection, vasculitis, fibromuscular dysplasia or significant stenosis. IMA: Patent without evidence of aneurysm, dissection, vasculitis or significant stenosis. Inflow: Patent without evidence of aneurysm, dissection, vasculitis or significant stenosis. Proximal Outflow: Bilateral common femoral and visualized portions of the superficial and profunda femoral arteries are patent without evidence of aneurysm, dissection, vasculitis or significant stenosis. Veins: No obvious venous abnormality within the limitations of this arterial phase study. Review of the MIP images confirms the above findings. NON-VASCULAR Lower chest: Small bilateral pleural effusions with dependent lower lobe atelectasis. Hepatobiliary: Multiple punctate calcified gallstones without evidence of acute cholecystitis. No biliary duct dilation or evidence of choledocholithiasis. The liver is unremarkable. Pancreas: Unremarkable. No pancreatic ductal dilatation or surrounding inflammatory changes. Spleen: Normal in size without focal abnormality. Adrenals/Urinary Tract: Kidneys enhance normally and symmetrically. No urinary tract calculi or obstruction. Bladder is unremarkable. The adrenals are stable. Stomach/Bowel: Wall thickening throughout the sigmoid colon again identified consistent with known sigmoid  diverticulitis. Interval placement of a percutaneous pigtail drainage catheter anterior to the sacrum, decompressing  the presacral fluid collection. There are several other smaller fluid collections within the left mid abdomen, largest measuring 2.1 cm in size, decreased since the previous evaluation suggesting communication with the presacral abscess evacuated by the pigtail drainage catheter. Continued follow-up is recommended. No evidence of high-grade bowel obstruction. Scattered gas fluid levels throughout nondilated loops of small bowel may suggest ileus. High attenuation retained oral contrast within the colon limits the evaluation for active gastrointestinal bleeding. There is no evidence of intraluminal contrast accumulation to suggest active gastrointestinal bleeding. There is marked mucosal hyperenhancement within the region of sigmoid diverticulitis described above. Lymphatic: No pathologic adenopathy. Reproductive: Uterus is atrophic. IUD again identified unchanged. No adnexal masses. Other: Trace pelvic free fluid. No free intraperitoneal gas. No abdominal wall hernia. Musculoskeletal: No acute or destructive bony abnormalities. Reconstructed images demonstrate no additional findings. IMPRESSION: VASCULAR 1. No evidence of active gastrointestinal bleeding. High attenuation stool/retained contrast within the distal colon does limit evaluation. 2.  Aortic Atherosclerosis (ICD10-I70.0). NON-VASCULAR 1. Sigmoid diverticulitis, with interval decrease in the diverticular abscesses seen on prior study after interval percutaneous pigtail drainage catheter placement. 2. Cholelithiasis without cholecystitis. 3. Small bilateral pleural effusions with dependent lower lobe atelectasis. 4. Scattered gas fluid levels throughout nondilated loops of small bowel, compatible with ileus. No evidence of high-grade obstruction. 5. Trace pelvic free fluid. Electronically Signed   By: Sharlet Salina M.D.   On: 10/15/2023  19:00    Anti-infectives: Anti-infectives (From admission, onward)    Start     Dose/Rate Route Frequency Ordered Stop   10/13/23 0200  piperacillin-tazobactam (ZOSYN) IVPB 3.375 g        3.375 g 12.5 mL/hr over 240 Minutes Intravenous Every 8 hours 10/12/23 2040     10/12/23 1845  piperacillin-tazobactam (ZOSYN) IVPB 3.375 g        3.375 g 100 mL/hr over 30 Minutes Intravenous  Once 10/12/23 1835 10/12/23 2042       Assessment/Plan: Sigmoid diverticulitis with multiple abscesses - No current indication for emergency surgery and no s/p IR drain 3/28 - continue abx - GI following with plans for EGD today in setting of GIB - can continue advancing diet from surgical standpoint/resume FLD   Stable from a surgical standpoint.  No acute need for surgery.    FEN: NPO for GI ID: [email or web address] VTE: held in setting of anemia  I reviewed Consultant GI notes, hospitalist notes, last 24 h vitals and pain scores, last 48 h intake and output, last 24 h labs and trends, and last 24 h imaging results.    LOS: 4 days   Eric Form, [facility] 10/16/2023, 9:24 AM Please see Amion for pager number during day hours 7:00am-4:30pm

## 2023-10-16 NOTE — Op Note (Signed)
 [facility] Patient Name: Susan Fletcher Procedure Date: 10/16/2023 MRN: [id number] Attending MD: Charna Elizabeth , MD, [id number] Date of Birth: 06-30-1946 CSN: [id number] Age: 78 Admit Type: Inpatient Procedure:                EGD with biopsies. Indications:              Melena, Acute post hemorrhagic anemia. Providers:                Charna Elizabeth, MD, Stephens Shire RN, RN, Salley Scarlet, Technician, Deeann Saint, CRNA Referring MD:             Brent General Medicines:                Monitored Anesthesia Care Complications:            No immediate complications. Estimated Blood Loss:     Estimated blood loss was minimal. Procedure:                Pre-Anesthesia Assessment: - Prior to the                            procedure, a history and physical was performed,                            and patient medications and allergies were                            reviewed. The patient's tolerance of previous                            anesthesia was also reviewed. The risks and                            benefits of the procedure and the sedation options                            and risks were discussed with the patient. All                            questions were answered, and informed consent was                            obtained. Prior Anticoagulants: The patient has                            taken no anticoagulant or antiplatelet agents. ASA                            Grade Assessment: II - A patient with mild systemic                            disease. After reviewing the risks and benefits,  the patient was deemed in satisfactory condition to                            undergo the procedure. After obtaining informed                            consent, the endoscope was passed under direct                            vision. Throughout the procedure, the patient's                            blood pressure, pulse, and  oxygen saturations were                            monitored continuously. The [id number]-[id number] ([id number])                            Olympus endoscope was introduced through the mouth,                            and advanced to the second part of duodenum. The                            EGD was accomplished without difficulty. The                            patient tolerated the procedure well. Scope In: Scope Out: Findings:      The examined esophagus and GEJ appeared widely patent and normal; SCJ       was measured at 35 cm.      Multiple dispersed erosions with no stigmata of recent bleeding were       found in the gastric body-biopsies were taken with a cold forceps for       histology.      The cardia and gastric fundus were normal on retroflexion.      There were a few erosions in the duodenal bulb; the rest of the examined       duodenum appeared normal. Impression:               - Normal appearing, widely patent esophagus and GEJ.                           - Erosive gastropathy with no stigmata of recent                            bleeding-biopsied.                           - Normal examined duodenum. Moderate Sedation:      MAC used. Recommendation:           - Clear liquid diet today.                           - Continue present medications.                           -  Perform a colonoscopy if patient agrees to prep. Procedure Code(s):        --- Professional ---                           [phone], Esophagogastroduodenoscopy, flexible,                            transoral; with biopsy, single or multiple Diagnosis Code(s):        --- Professional ---                           K92.1, Melena (includes Hematochezia)                           D62, Acute posthemorrhagic anemia                           K92.2, Gastrointestinal hemorrhage, unspecified CPT copyright 2022 [employer]. All rights reserved. The codes documented in this report are preliminary and upon coder  review may  be revised to meet current compliance requirements. Charna Elizabeth, MD Charna Elizabeth, MD 10/16/2023 2:48:29 PM This report has been signed electronically. Number of Addenda: 0

## 2023-10-16 NOTE — Anesthesia Preprocedure Evaluation (Signed)
 Anesthesia Evaluation  Patient identified by MRN, date of birth, ID band Patient awake    Reviewed: Allergy & Precautions, H&P , NPO status , Patient's Chart, lab work & pertinent test results  Airway Mallampati: II   Neck ROM: full    Dental   Pulmonary former smoker   breath sounds clear to auscultation       Cardiovascular negative cardio ROS  Rhythm:regular Rate:Normal     Neuro/Psych  Headaches    GI/Hepatic GI bleeding   Endo/Other    Renal/GU      Musculoskeletal   Abdominal   Peds  Hematology  (+) Blood dyscrasia, anemia Hemoglobin 8.7   Anesthesia Other Findings   Reproductive/Obstetrics                             Anesthesia Physical Anesthesia Plan  ASA: 2  Anesthesia Plan: MAC   Post-op Pain Management:    Induction: Intravenous  PONV Risk Score and Plan: 2 and Propofol infusion and Treatment may vary due to age or medical condition  Airway Management Planned: Nasal Cannula  Additional Equipment:   Intra-op Plan:   Post-operative Plan:   Informed Consent: I have reviewed the patients History and Physical, chart, labs and discussed the procedure including the risks, benefits and alternatives for the proposed anesthesia with the patient or authorized representative who has indicated his/her understanding and acceptance.     Dental advisory given  Plan Discussed with: CRNA, Anesthesiologist and Surgeon  Anesthesia Plan Comments:        Anesthesia Quick Evaluation

## 2023-10-17 ENCOUNTER — Encounter (HOSPITAL_COMMUNITY): Payer: Self-pay | Admitting: Anesthesiology

## 2023-10-17 ENCOUNTER — Encounter (HOSPITAL_COMMUNITY): Payer: Self-pay | Admitting: Gastroenterology

## 2023-10-17 ENCOUNTER — Encounter (HOSPITAL_COMMUNITY)
Admission: EM | Disposition: A | Discharge status: Home / Self Care | Payer: Self-pay | Source: Home / Self Care | Attending: Internal Medicine

## 2023-10-17 DIAGNOSIS — K5720 Diverticulitis of large intestine with perforation and abscess without bleeding: Secondary | ICD-10-CM | POA: Diagnosis not present

## 2023-10-17 LAB — AEROBIC/ANAEROBIC CULTURE W GRAM STAIN (SURGICAL/DEEP WOUND)

## 2023-10-17 LAB — BASIC METABOLIC PANEL WITH GFR
Anion gap: 10 (ref 5–15)
BUN: 14 mg/dL (ref 8–23)
CO2: 22 mmol/L (ref 22–32)
Calcium: 7.6 mg/dL — ABNORMAL LOW (ref 8.9–10.3)
Chloride: 101 mmol/L (ref 98–111)
Creatinine, Ser: <0.3 mg/dL — ABNORMAL LOW (ref 0.44–1.00)
Glucose, Bld: 97 mg/dL (ref 70–99)
Potassium: 3 mmol/L — ABNORMAL LOW (ref 3.5–5.1)
Sodium: 133 mmol/L — ABNORMAL LOW (ref 135–145)

## 2023-10-17 LAB — CBC
HCT: 27.4 % — ABNORMAL LOW (ref 36.0–46.0)
Hemoglobin: 9.3 g/dL — ABNORMAL LOW (ref 12.0–15.0)
MCH: 30.9 pg (ref 26.0–34.0)
MCHC: 33.9 g/dL (ref 30.0–36.0)
MCV: 91 fL (ref 80.0–100.0)
Platelets: 445 10*3/uL — ABNORMAL HIGH (ref 150–400)
RBC: 3.01 MIL/uL — ABNORMAL LOW (ref 3.87–5.11)
RDW: 14.7 % (ref 11.5–15.5)
WBC: 19.4 10*3/uL — ABNORMAL HIGH (ref 4.0–10.5)
nRBC: 0 % (ref 0.0–0.2)

## 2023-10-17 LAB — CULTURE, BLOOD (ROUTINE X 2)
Culture: NO GROWTH
Culture: NO GROWTH

## 2023-10-17 LAB — PHOSPHORUS: Phosphorus: 2.5 mg/dL (ref 2.5–4.6)

## 2023-10-17 LAB — SURGICAL PATHOLOGY

## 2023-10-17 LAB — MAGNESIUM: Magnesium: 1.8 mg/dL (ref 1.7–2.4)

## 2023-10-17 SURGERY — CANCELLED PROCEDURE

## 2023-10-17 MED ORDER — PANTOPRAZOLE SODIUM 40 MG PO TBEC
40.0000 mg | DELAYED_RELEASE_TABLET | Freq: Every day | ORAL | Status: DC
  Administered 2023-10-17 – 2023-10-19 (×3): 40 mg via ORAL
  Filled 2023-10-17 (×3): qty 1

## 2023-10-17 MED ORDER — POTASSIUM CHLORIDE 10 MEQ/100ML IV SOLN
10.0000 meq | INTRAVENOUS | Status: AC
  Administered 2023-10-17 (×6): 10 meq via INTRAVENOUS
  Filled 2023-10-17 (×6): qty 100

## 2023-10-17 MED ORDER — MAGNESIUM SULFATE 2 GM/50ML IV SOLN
2.0000 g | Freq: Once | INTRAVENOUS | Status: AC
  Administered 2023-10-17: 2 g via INTRAVENOUS
  Filled 2023-10-17: qty 50

## 2023-10-17 NOTE — Anesthesia Preprocedure Evaluation (Signed)
 Anesthesia Evaluation    Reviewed: Allergy & Precautions, Patient's Chart, lab work & pertinent test results  Airway        Dental   Pulmonary former smoker          Cardiovascular      Neuro/Psych  Headaches    GI/Hepatic   Endo/Other    Renal/GU      Musculoskeletal   Abdominal   Peds  Hematology  (+) Blood dyscrasia, anemia Lab Results      Component                Value               Date                      WBC                      19.4 (H)            10/17/2023                HGB                      9.3 (L)             10/17/2023                HCT                      27.4 (L)            10/17/2023                MCV                      91.0                10/17/2023                PLT                      445 (H)             10/17/2023              Anesthesia Other Findings   Reproductive/Obstetrics                             Anesthesia Physical Anesthesia Plan  ASA: 2  Anesthesia Plan: MAC   Post-op Pain Management:    Induction:   PONV Risk Score and Plan: 2 and Treatment may vary due to age or medical condition and Propofol infusion  Airway Management Planned: Natural Airway and Nasal Cannula  Additional Equipment: None  Intra-op Plan:   Post-operative Plan:   Informed Consent:      Dental advisory given  Plan Discussed with: CRNA  Anesthesia Plan Comments: (Colonoscopy for iron deficiency anemia)       Anesthesia Quick Evaluation

## 2023-10-17 NOTE — Progress Notes (Signed)
 Subjective: Feeling well.  The colonic prep helped her.  Objective: Vital signs in last 24 hours: Temp:  [97.1 F (36.2 C)-98.1 F (36.7 C)] 98.1 F (36.7 C) (04/01 1140) Pulse Rate:  [81-86] 84 (04/01 1140) Resp:  [15-21] 16 (04/01 1140) BP: (96-116)/(56-75) 100/64 (04/01 1324) SpO2:  [96 %-99 %] 96 % (04/01 1140) Last BM Date : 10/17/23  Intake/Output from previous day: 03/31 0701 - 04/01 0700 In: 1233.5 [P.O.:480; I.V.:250; IV Piggyback:493.5] Out: 10 [Drains:10] Intake/Output this shift: Total I/O In: 661 [I.V.:5; Other:5; IV Piggyback:651] Out: -   General appearance: alert and no distress GI: some tenderness at the drain sites  Lab Results: Recent Labs    10/15/23 0439 10/15/23 1159 10/16/23 0117 10/16/23 0711 10/16/23 1552 10/16/23 1848 10/17/23 0516  WBC 17.3*  --  20.6*  --   --   --  19.4*  HGB 6.5*   < > 8.8*   < > 8.9* 9.3* 9.3*  HCT 19.6*   < > 25.3*   < > 26.1* 27.4* 27.4*  PLT 448*  --  343  --   --   --  445*   < > = values in this interval not displayed.   BMET Recent Labs    10/15/23 0439 10/16/23 0117 10/17/23 0516  NA 134* 133* 133*  K 3.8 3.2* 3.0*  CL 101 102 101  CO2 26 22 22   GLUCOSE 114* 112* 97  BUN 36* 21 14  CREATININE 0.47 0.38* <0.30*  CALCIUM 7.5* 7.1* 7.6*   LFT No results for input(s): "PROT", "ALBUMIN", "AST", "ALT", "ALKPHOS", "BILITOT", "BILIDIR", "IBILI" in the last 72 hours. PT/INR No results for input(s): "LABPROT", "INR" in the last 72 hours. Hepatitis Panel No results for input(s): "HEPBSAG", "HCVAB", "HEPAIGM", "HEPBIGM" in the last 72 hours. C-Diff No results for input(s): "CDIFFTOX" in the last 72 hours. Fecal Lactopherrin No results for input(s): "FECLLACTOFRN" in the last 72 hours.  Studies/Results: CT ANGIO GI BLEED Result Date: 10/15/2023 CLINICAL DATA:  Sigmoid diverticulitis with perirectal abscess status post percutaneous drain, concern for gastrointestinal bleeding EXAM: CTA ABDOMEN AND PELVIS  WITHOUT AND WITH CONTRAST TECHNIQUE: Multidetector CT imaging of the abdomen and pelvis was performed using the standard protocol during bolus administration of intravenous contrast. Multiplanar reconstructed images and MIPs were obtained and reviewed to evaluate the vascular anatomy. RADIATION DOSE REDUCTION: This exam was performed according to the departmental dose-optimization program which includes automated exposure control, adjustment of the mA and/or kV according to patient size and/or use of iterative reconstruction technique. CONTRAST:  80mL OMNIPAQUE IOHEXOL 350 MG/ML SOLN COMPARISON:  10/13/2023, 10/12/2023 FINDINGS: VASCULAR Aorta: Normal caliber aorta without aneurysm, dissection, vasculitis or significant stenosis. Diffuse atherosclerosis. Celiac: Patent without evidence of aneurysm, dissection, vasculitis or significant stenosis. SMA: Patent without evidence of aneurysm, dissection, vasculitis or significant stenosis. Renals: Both renal arteries are patent without evidence of aneurysm, dissection, vasculitis, fibromuscular dysplasia or significant stenosis. IMA: Patent without evidence of aneurysm, dissection, vasculitis or significant stenosis. Inflow: Patent without evidence of aneurysm, dissection, vasculitis or significant stenosis. Proximal Outflow: Bilateral common femoral and visualized portions of the superficial and profunda femoral arteries are patent without evidence of aneurysm, dissection, vasculitis or significant stenosis. Veins: No obvious venous abnormality within the limitations of this arterial phase study. Review of the MIP images confirms the above findings. NON-VASCULAR Lower chest: Small bilateral pleural effusions with dependent lower lobe atelectasis. Hepatobiliary: Multiple punctate calcified gallstones without evidence of acute cholecystitis. No biliary duct dilation or evidence  of choledocholithiasis. The liver is unremarkable. Pancreas: Unremarkable. No pancreatic ductal  dilatation or surrounding inflammatory changes. Spleen: Normal in size without focal abnormality. Adrenals/Urinary Tract: Kidneys enhance normally and symmetrically. No urinary tract calculi or obstruction. Bladder is unremarkable. The adrenals are stable. Stomach/Bowel: Wall thickening throughout the sigmoid colon again identified consistent with known sigmoid diverticulitis. Interval placement of a percutaneous pigtail drainage catheter anterior to the sacrum, decompressing the presacral fluid collection. There are several other smaller fluid collections within the left mid abdomen, largest measuring 2.1 cm in size, decreased since the previous evaluation suggesting communication with the presacral abscess evacuated by the pigtail drainage catheter. Continued follow-up is recommended. No evidence of high-grade bowel obstruction. Scattered gas fluid levels throughout nondilated loops of small bowel may suggest ileus. High attenuation retained oral contrast within the colon limits the evaluation for active gastrointestinal bleeding. There is no evidence of intraluminal contrast accumulation to suggest active gastrointestinal bleeding. There is marked mucosal hyperenhancement within the region of sigmoid diverticulitis described above. Lymphatic: No pathologic adenopathy. Reproductive: Uterus is atrophic. IUD again identified unchanged. No adnexal masses. Other: Trace pelvic free fluid. No free intraperitoneal gas. No abdominal wall hernia. Musculoskeletal: No acute or destructive bony abnormalities. Reconstructed images demonstrate no additional findings. IMPRESSION: VASCULAR 1. No evidence of active gastrointestinal bleeding. High attenuation stool/retained contrast within the distal colon does limit evaluation. 2.  Aortic Atherosclerosis (ICD10-I70.0). NON-VASCULAR 1. Sigmoid diverticulitis, with interval decrease in the diverticular abscesses seen on prior study after interval percutaneous pigtail drainage  catheter placement. 2. Cholelithiasis without cholecystitis. 3. Small bilateral pleural effusions with dependent lower lobe atelectasis. 4. Scattered gas fluid levels throughout nondilated loops of small bowel, compatible with ileus. No evidence of high-grade obstruction. 5. Trace pelvic free fluid. Electronically Signed   By: Sharlet Salina M.D.   On: 10/15/2023 19:00    Medications: Scheduled:  folic acid  1 mg Oral Daily   pantoprazole (PROTONIX) IV  40 mg Intravenous Q12H   sodium chloride flush  5 mL Intracatheter Q8H   Continuous:  ampicillin-sulbactam (UNASYN) IV Stopped (10/17/23 1301)   potassium chloride 10 mEq (10/17/23 1439)    Assessment/Plan: 1) Anemia - stable. 2) Diverticulitis with perforation and abscess - improving.   Clinically the patient is well.  Her colonoscopy was cancelled as she has a diverticulitis with perforation.  At some point in the future, 6+ weeks, she will need a colonoscopy as an outpatient for further evaluation.  Plan: 1) Continue with Unasyn. 2) Continue with drain per IR. 3) Convert to PO pantoprazole every day.  LOS: 5 days   Susan Fletcher D 10/17/2023, 2:51 PM

## 2023-10-17 NOTE — Progress Notes (Signed)
 PROGRESS NOTE    Susan Fletcher  JXB:[id number] DOB: [birth year] DOA: 10/12/2023 PCP: Patient, No Pcp Per    Brief Narrative:   Susan Fletcher is a 78 y.o. female with  no known significant medical history who presented to the ED for evaluation of abdominal pain. Patient reports about 3 weeks ago she woke from sleep around midnight with severe lower abdominal pain.  Pain felt like "knives stabbing" her in the abdomen.  This intense pain lasted about 2 days before she had some improvement.  She did not seek medical attention at that time.  She says since then she has had on and off episodes of recurrent severe abdominal pain although she has had some constant baseline pain.   For the last couple days she developed severe pain again.  She has had associated chills and diaphoresis.  She reports low appetite and significant decreased oral intake.  She says she has not had any bowel movements for quite some time.  She says she is not passing flatus.   She has not had a previous colonoscopy.  She denies any prior abdominal surgeries.  She does not take any medications except for occasional Excedrin for headache.   In the ED, BP 120/65, pulse 98, RR 17, temp 98.2 F, SpO2 100% on room air.  WBC 25.8, hemoglobin 10.7, platelets 530,000, sodium 120, potassium 3.1, bicarb 22, BUN 11, creatinine 0.55, serum glucose 102, LFTs within normal limits, lipase 25.  Lactic acid 1.4. UA showed 80 ketones, negative nitrites, small leukocytes, 0-5 RBCs and WBCs, no bacteria.  Blood cultures in process. CT abdomen/pelvis with contrast showed severe sigmoid diverticulitis with multiple pericolonic abscesses.  Largest measures 6.3 x 2.8 cm anteriorly in the pelvis.  Another measured 3.6 x 3.1 cm posteriorly.  Mild dilatation of more proximal colon is noted suggesting partial obstruction secondary to this inflammation.  Hepatic steatosis and mild cholelithiasis also noted.   Patient was given 1 L LR, IV Zosyn, IV K 10 mEq x 4.   EDP spoke with general surgery (Dr. Maisie Fus) who recommended medical admission and will see in consultation today.  The hospitalist service was consulted to admit.  Assessment & Plan:   Concern for upper GI bleed Hemoglobin on admission, 10.7 trended down to 9.4 with acute drop of 6.5 followed by 5.3 today.  BUN also elevated.  Not on anticoagulants, antiplatelets at baseline and not on heparin/Lovenox for DVT prophylaxis.  Has been taking a few doses of IV Toradol for pain.  Anemia panel with findings of slightly low folate.  CTA GI bleeding study on 3/30 with no evidence of active gastrointestinal bleeding.  EGD 3/21 without significant findings. -- GI following, appreciate assistance -- Hgb 10.7>9.8>9.4>6.5>5.3>8.8>8.7>8.9>9.3 -- Transfused 2 unit PRBCs 3/30 -- Protonix 40 mg IV every 12 hours -- Folate 1 mg p.o. daily -- GI to consider colonoscopy this afternoon, but given stability of hemoglobin in the setting of severe sigmoid diverticulitis with perforation with intra-abdominal abscess, may be reasonable to defer at this time -- Monitor on telemetry  Severe sigmoid diverticulitis with multiple pericolonic abscesses: Patient presenting with 3-week history of abdominal pain with acute worsening.  CT abdomen/pelvis with severe sigmoid diverticulitis with multiple pericolonic abscesses largest measuring 6.3 x 2.8 cm.  Additionally there is concern for associated partial colonic obstruction due to the inflammation.  Interventional radiology was consulted and patient underwent drain placement on 10/13/2023 -- General Surgery and IR following, appreciate assistance -- WBC 25.8>30.9>23.5>17.3>20.6>19.4 -- Blood cultures x 2:  No growth x 5 days -- Operative/drain culture: + Klebsiella pneumonia with resistance to ampicillin -- Unasyn 3 g IV q6h -- NPO -- Pain control, antiemetics, supportive care -- Monitor drain output, will need outpatient follow-up with interventional radiology/CCS for further  management -- Will need outpatient follow-up with gastroenterology for consideration of colonoscopy in 6-8 weeks -- CBC daily  Hypokalemia: Hypophosphatemia Hypomagnesemia Potassium 3.1 today, will replete. -- BMP, magnesium, phosphorus in the a.m.   Hyponatremia: Mild and secondary to volume depletion.   -- Na 128>127>132>134>133 -- BMP daily    DVT prophylaxis: SCDs Start: 10/12/23 1939    Code Status: Full Code Family Communication: No family present at bedside this morning  Disposition Plan:  Level of care: Progressive Status is: Inpatient Remains inpatient appropriate because: IV antibiotics    Consultants:  General surgery Interventional radiology Gastroenterology, Dr. Loreta Ave  Procedures:  CT-guided drain placement by IR 3/28 EGD 3/31  Antimicrobials:  Zosyn 3/27 - 3/30 Unasyn 3/31>>   Subjective: Patient seen examined bedside, lying in bed.  No complaints this morning, no significant abdominal pain.  Drain with decreased output.  Hemoglobin stable, up to 9.3 this morning.  Currently n.p.o. for potential colonoscopy this afternoon.  Discussed with general surgery, given stability, rising if hemoglobin would like to defer colonoscopy to the outpatient setting given acute diverticulitis with perforation/abscess. No other specific complaints, questions or concerns at this time.  Denies headache, no visual changes, no chest pain, no palpitations, no fever/chills/night sweats, no nausea/vomiting/diarrhea, no focal weakness, no fatigue, no paresthesia.  No acute events overnight per nurse staff.  Objective: Vitals:   10/16/23 1510 10/16/23 1600 10/16/23 1921 10/17/23 0419  BP: (!) 106/58 105/67 116/75 105/61  Pulse: 83 86 81 86  Resp: 15 15 15 15   Temp:  97.8 F (36.6 C) (!) 97.1 F (36.2 C) 97.8 F (36.6 C)  TempSrc:   Oral Oral  SpO2: (P) 97% 96% 99% 96%  Weight:      Height:        Intake/Output Summary (Last 24 hours) at 10/17/2023 1011 Last data filed at  10/17/2023 0750 Gross per 24 hour  Intake 1333.53 ml  Output 10 ml  Net 1323.53 ml   Filed Weights   10/12/23 1314  Weight: 40.6 kg    Examination:  Physical Exam: GEN: NAD, alert and oriented x 3, thin/elderly in appearance HEENT: NCAT, PERRL, EOMI, sclera clear, MMM PULM: CTAB w/o wheezes/crackles, normal respiratory effort, on room air CV: RRR w/o M/G/R GI: abd soft, NTND, + BS; drain noted with minimal output MSK: no peripheral edema, muscle strength globally intact 5/5 bilateral upper/lower extremities NEURO: CN II-XII intact, no focal deficits, sensation to light touch intact PSYCH: normal mood/affect Integumentary: dry/intact, no rashes or wounds    Data Reviewed: I have personally reviewed following labs and imaging studies  CBC: Recent Labs  Lab 10/13/23 0348 10/14/23 0511 10/15/23 0439 10/15/23 1159 10/16/23 0117 10/16/23 0711 10/16/23 1552 10/16/23 1848 10/17/23 0516  WBC 30.9* 23.5* 17.3*  --  20.6*  --   --   --  19.4*  HGB 9.8* 9.4* 6.5*   < > 8.8* 8.7* 8.9* 9.3* 9.3*  HCT 29.1* 28.3* 19.6*   < > 25.3* 24.8* 26.1* 27.4* 27.4*  MCV 91.8 94.6 92.9  --  87.8  --   --   --  91.0  PLT 488* 487* 448*  --  343  --   --   --  445*   < > =  values in this interval not displayed.   Basic Metabolic Panel: Recent Labs  Lab 10/13/23 0348 10/14/23 0511 10/15/23 0439 10/16/23 0117 10/17/23 0516  NA 127* 132* 134* 133* 133*  K 3.1* 3.4* 3.8 3.2* 3.0*  CL 94* 98 101 102 101  CO2 18* 20* 26 22 22   GLUCOSE 83 99 114* 112* 97  BUN 7* 12 36* 21 14  CREATININE 0.41* 0.50 0.47 0.38* <0.30*  CALCIUM 7.7* 7.8* 7.5* 7.1* 7.6*  MG  --  1.7 1.9  --  1.8  PHOS  --  2.2* 1.5*  --  2.5   GFR: CrCl cannot be calculated (This lab value cannot be used to calculate CrCl because it is not a number: <0.30). Liver Function Tests: Recent Labs  Lab 10/12/23 1351  AST 16  ALT 13  ALKPHOS 74  BILITOT 0.7  PROT 5.9*  ALBUMIN 2.3*   Recent Labs  Lab 10/12/23 1351   LIPASE 25   No results for input(s): "AMMONIA" in the last 168 hours. Coagulation Profile: Recent Labs  Lab 10/13/23 0828  INR 1.3*   Cardiac Enzymes: No results for input(s): "CKTOTAL", "CKMB", "CKMBINDEX", "TROPONINI" in the last 168 hours. BNP (last 3 results) No results for input(s): "PROBNP" in the last 8760 hours. HbA1C: No results for input(s): "HGBA1C" in the last 72 hours. CBG: No results for input(s): "GLUCAP" in the last 168 hours. Lipid Profile: No results for input(s): "CHOL", "HDL", "LDLCALC", "TRIG", "CHOLHDL", "LDLDIRECT" in the last 72 hours. Thyroid Function Tests: No results for input(s): "TSH", "T4TOTAL", "FREET4", "T3FREE", "THYROIDAB" in the last 72 hours. Anemia Panel: Recent Labs    10/15/23 1159 10/15/23 1200  VITAMINB12  --  549  FOLATE  --  5.2*  FERRITIN  --  89  TIBC  --  151*  IRON  --  59  RETICCTPCT 1.1  --    Sepsis Labs: Recent Labs  Lab 10/12/23 1643 10/12/23 2107  LATICACIDVEN 1.4 0.9    Recent Results (from the past 240 hours)  Blood culture (routine x 2)     Status: None   Collection Time: 10/12/23  4:40 PM   Specimen: BLOOD  Result Value Ref Range Status   Specimen Description   Final    BLOOD SITE NOT SPECIFIED Performed at [facility], [address] [place], [place] [address]    Special Requests   Final    BOTTLES DRAWN AEROBIC AND ANAEROBIC Blood Culture results may not be optimal due to an inadequate volume of blood received in culture bottles Performed at [facility], [address], [place], [place] [address]    Culture   Final    NO GROWTH 5 DAYS Performed at [facility], [address], [place], [place] [address]    Report Status 10/17/2023 FINAL  Final  Blood culture (routine x 2)     Status: None   Collection Time: 10/12/23  4:43 PM   Specimen: BLOOD  Result Value Ref Range Status   Specimen Description   Final    BLOOD SITE NOT SPECIFIED Performed at  [facility], [address], [place], [place] [address]    Special Requests   Final    BOTTLES DRAWN AEROBIC AND ANAEROBIC Blood Culture results may not be optimal due to an inadequate volume of blood received in culture bottles Performed at [facility], [address], [place], [place] [address]    Culture   Final  NO GROWTH 5 DAYS Performed at [facility], [address], [place], [place] [address]    Report Status 10/17/2023 FINAL  Final  Aerobic/Anaerobic Culture w Gram Stain (surgical/deep wound)     Status: None (Preliminary result)   Collection Time: 10/13/23  2:31 PM   Specimen: Abscess  Result Value Ref Range Status   Specimen Description   Final    ABSCESS Performed at [facility], [address] [place], [place] [address]    Special Requests   Final    NONE Performed at [facility], [address] [place], [place] [address]    Gram Stain   Final    ABUNDANT WBC PRESENT, PREDOMINANTLY PMN FEW GRAM NEGATIVE RODS    Culture   Final    RARE KLEBSIELLA PNEUMONIAE CULTURE REINCUBATED FOR BETTER GROWTH MODERATE BACTEROIDES THETAIOTAOMICRON BETA LACTAMASE NEGATIVE Performed at [facility], [address] [place], [place] [address]    Report Status PENDING  Incomplete   Organism ID, Bacteria KLEBSIELLA PNEUMONIAE  Final      Susceptibility   Klebsiella pneumoniae - MIC*    AMPICILLIN >=32 RESISTANT Resistant     CEFEPIME <=0.12 SENSITIVE Sensitive     CEFTAZIDIME <=1 SENSITIVE Sensitive     CEFTRIAXONE <=0.25 SENSITIVE Sensitive     CIPROFLOXACIN <=0.25 SENSITIVE Sensitive     GENTAMICIN <=1 SENSITIVE Sensitive     IMIPENEM <=0.25 SENSITIVE Sensitive     TRIMETH/SULFA <=20 SENSITIVE Sensitive     AMPICILLIN/SULBACTAM 4 SENSITIVE Sensitive     PIP/TAZO <=4 SENSITIVE Sensitive ug/mL    * RARE KLEBSIELLA PNEUMONIAE         Radiology Studies: CT ANGIO GI  BLEED Result Date: 10/15/2023 CLINICAL DATA:  Sigmoid diverticulitis with perirectal abscess status post percutaneous drain, concern for gastrointestinal bleeding EXAM: CTA ABDOMEN AND PELVIS WITHOUT AND WITH CONTRAST TECHNIQUE: Multidetector CT imaging of the abdomen and pelvis was performed using the standard protocol during bolus administration of intravenous contrast. Multiplanar reconstructed images and MIPs were obtained and reviewed to evaluate the vascular anatomy. RADIATION DOSE REDUCTION: This exam was performed according to the departmental dose-optimization program which includes automated exposure control, adjustment of the mA and/or kV according to patient size and/or use of iterative reconstruction technique. CONTRAST:  80mL OMNIPAQUE IOHEXOL 350 MG/ML SOLN COMPARISON:  10/13/2023, 10/12/2023 FINDINGS: VASCULAR Aorta: Normal caliber aorta without aneurysm, dissection, vasculitis or significant stenosis. Diffuse atherosclerosis. Celiac: Patent without evidence of aneurysm, dissection, vasculitis or significant stenosis. SMA: Patent without evidence of aneurysm, dissection, vasculitis or significant stenosis. Renals: Both renal arteries are patent without evidence of aneurysm, dissection, vasculitis, fibromuscular dysplasia or significant stenosis. IMA: Patent without evidence of aneurysm, dissection, vasculitis or significant stenosis. Inflow: Patent without evidence of aneurysm, dissection, vasculitis or significant stenosis. Proximal Outflow: Bilateral common femoral and visualized portions of the superficial and profunda femoral arteries are patent without evidence of aneurysm, dissection, vasculitis or significant stenosis. Veins: No obvious venous abnormality within the limitations of this arterial phase study. Review of the MIP images confirms the above findings. NON-VASCULAR Lower chest: Small bilateral pleural effusions with dependent lower lobe atelectasis. Hepatobiliary: Multiple punctate  calcified gallstones without evidence of acute cholecystitis. No biliary duct dilation or evidence of choledocholithiasis. The liver is unremarkable. Pancreas: Unremarkable. No pancreatic ductal dilatation or surrounding inflammatory changes. Spleen: Normal in size without focal abnormality. Adrenals/Urinary Tract: Kidneys enhance normally and symmetrically. No urinary tract calculi or obstruction. Bladder is unremarkable. The adrenals  are stable. Stomach/Bowel: Wall thickening throughout the sigmoid colon again identified consistent with known sigmoid diverticulitis. Interval placement of a percutaneous pigtail drainage catheter anterior to the sacrum, decompressing the presacral fluid collection. There are several other smaller fluid collections within the left mid abdomen, largest measuring 2.1 cm in size, decreased since the previous evaluation suggesting communication with the presacral abscess evacuated by the pigtail drainage catheter. Continued follow-up is recommended. No evidence of high-grade bowel obstruction. Scattered gas fluid levels throughout nondilated loops of small bowel may suggest ileus. High attenuation retained oral contrast within the colon limits the evaluation for active gastrointestinal bleeding. There is no evidence of intraluminal contrast accumulation to suggest active gastrointestinal bleeding. There is marked mucosal hyperenhancement within the region of sigmoid diverticulitis described above. Lymphatic: No pathologic adenopathy. Reproductive: Uterus is atrophic. IUD again identified unchanged. No adnexal masses. Other: Trace pelvic free fluid. No free intraperitoneal gas. No abdominal wall hernia. Musculoskeletal: No acute or destructive bony abnormalities. Reconstructed images demonstrate no additional findings. IMPRESSION: VASCULAR 1. No evidence of active gastrointestinal bleeding. High attenuation stool/retained contrast within the distal colon does limit evaluation. 2.  Aortic  Atherosclerosis (ICD10-I70.0). NON-VASCULAR 1. Sigmoid diverticulitis, with interval decrease in the diverticular abscesses seen on prior study after interval percutaneous pigtail drainage catheter placement. 2. Cholelithiasis without cholecystitis. 3. Small bilateral pleural effusions with dependent lower lobe atelectasis. 4. Scattered gas fluid levels throughout nondilated loops of small bowel, compatible with ileus. No evidence of high-grade obstruction. 5. Trace pelvic free fluid. Electronically Signed   By: Sharlet Salina M.D.   On: 10/15/2023 19:00        Scheduled Meds:  folic acid  1 mg Oral Daily   pantoprazole (PROTONIX) IV  40 mg Intravenous Q12H   sodium chloride flush  5 mL Intracatheter Q8H   Continuous Infusions:  ampicillin-sulbactam (UNASYN) IV Stopped (10/17/23 0651)   potassium chloride 10 mEq (10/17/23 1005)     LOS: 5 days    Time spent: 48 minutes spent on 10/17/2023 caring for this patient face-to-face including chart review, ordering labs/tests, documenting, discussion with nursing staff, consultants, updating family and interview/physical exam    Alvira Philips [place], DO [facility] Available via Epic secure chat 7am-7pm After these hours, please refer to coverage provider listed on [email or web address] 10/17/2023, 10:11 AM

## 2023-10-17 NOTE — TOC Initial Note (Signed)
 Transition of Care [facility] - Initial/Assessment Note    Patient Details  Name: Susan Fletcher MRN: [id number] Date of Birth: November 10, 1945  [facility]) CM/SW Contact:    Lanier Clam, RN Phone Number: 10/17/2023, 4:30 PM  Clinical Narrative: d/c plan home.  Provided w/PCP listing for [facility]. Has own transport home.               Expected Discharge Plan: Home/Self Care Barriers to Discharge: Continued Medical Work up   Patient Goals and CMS Choice Patient states their goals for this hospitalization and ongoing recovery are:: Home CMS Medicare.gov Compare Post Acute Care list provided to:: Patient Choice offered to / list presented to : Patient      Expected Discharge Plan and Services In-house Referral: NA Discharge Planning Services: CM Consult   Living arrangements for the past 2 months: Single Family Home                                      Prior Living Arrangements/Services Living arrangements for the past 2 months: Single Family Home Lives with:: Self Patient language and need for interpreter reviewed:: Yes Do you feel safe going back to the place where you live?: Yes      Need for Family Participation in Patient Care: No (Comment) Care giver support system in place?: No (comment) (Staes does not have any family)   Criminal Activity/Legal Involvement Pertinent to Current Situation/Hospitalization: No - Comment as needed  Activities of Daily Living   ADL Screening (condition at time of admission) Independently performs ADLs?: Yes (appropriate for developmental age) Is the patient deaf or have difficulty hearing?: No Does the patient have difficulty seeing, even when wearing glasses/contacts?: No Does the patient have difficulty concentrating, remembering, or making decisions?: No  Permission Sought/Granted Permission sought to share information with : Case Manager Permission granted to share information with : Yes, Verbal Permission Granted               Emotional Assessment Appearance:: Appears stated age Attitude/Demeanor/Rapport: Engaged Affect (typically observed): Accepting, Appropriate Orientation: : Oriented to Self, Oriented to Place, Oriented to  Time, Oriented to Situation Alcohol / Substance Use: Not Applicable Psych Involvement: No (comment)  Admission diagnosis:  Hypokalemia [E87.6] Hyponatremia [E87.1] Diverticulitis of large intestine with abscess without bleeding [K57.20] Abscess of sigmoid colon due to diverticulitis [K57.20] Partial intestinal obstruction, unspecified cause (HCC) [K56.600] Patient Active Problem List   Diagnosis Date Noted   Abscess of sigmoid colon due to diverticulitis 10/12/2023   Hypokalemia 10/12/2023   Hyponatremia 10/12/2023   Normocytic anemia 10/12/2023   Melena 10/12/2023   PCP:  Patient, No Pcp Per Pharmacy:   [id number] [place], [place] - [address] AT [facility] [address] [place] [address] Phone: [phone] Fax: [phone]     Social Drivers of Health (SDOH) Social History: SDOH Screenings   Food Insecurity: No Food Insecurity (10/12/2023)  Housing: Unknown (10/12/2023)  Transportation Needs: No Transportation Needs (10/12/2023)  Utilities: Not At Risk (10/12/2023)  Depression (PHQ2-9): Low Risk  (11/04/2021)  Social Connections: Moderately Integrated (10/12/2023)  Tobacco Use: Medium Risk (10/16/2023)   SDOH Interventions:     Readmission Risk Interventions     No data to display

## 2023-10-17 NOTE — Progress Notes (Signed)
 Referring Physician(s): Thomas,A  Supervising Physician: Roanna Banning  Patient Status:  [facility] - In-pt  Chief Complaint:  Perforated diverticulitis, perirectal fluid collection/abscess; status post right transgluteal approach drain placement on 10/13/2023   Subjective: Patient currently without new complaints; EGD done yesterday- erosive gastropathy with no stigmata of recent bleeding, otherwise normal; output from TG drain serous and minimal    Allergies: Patient has no known allergies.  Medications: Prior to Admission medications   Medication Sig Start Date End Date Taking? Authorizing Provider  aspirin-acetaminophen-caffeine (EXCEDRIN MIGRAINE) [phone] MG tablet Take 1 tablet by mouth every 6 (six) hours as needed for headache.   Yes [provider]  ibuprofen (ADVIL) 200 MG tablet Take 200 mg by mouth every 6 (six) hours as needed for mild pain (pain score 1-3).   Yes [provider]     Vital Signs: BP 105/61   Pulse 86   Temp 97.8 F (36.6 C) (Oral)   Resp 15   Ht 5\' 1"  (1.549 m)   Wt 89 lb 9.6 oz (40.6 kg)   SpO2 96%   BMI 16.93 kg/m   Physical Exam awake/alert; rt TG drain intact, insertion site NT, OP 10 cc serous fluid; drain flushed without difficulty, minimal return  Imaging: CT ANGIO GI BLEED Result Date: 10/15/2023 CLINICAL DATA:  Sigmoid diverticulitis with perirectal abscess status post percutaneous drain, concern for gastrointestinal bleeding EXAM: CTA ABDOMEN AND PELVIS WITHOUT AND WITH CONTRAST TECHNIQUE: Multidetector CT imaging of the abdomen and pelvis was performed using the standard protocol during bolus administration of intravenous contrast. Multiplanar reconstructed images and MIPs were obtained and reviewed to evaluate the vascular anatomy. RADIATION DOSE REDUCTION: This exam was performed according to the departmental dose-optimization program which includes automated exposure control, adjustment of the mA and/or kV  according to patient size and/or use of iterative reconstruction technique. CONTRAST:  80mL OMNIPAQUE IOHEXOL 350 MG/ML SOLN COMPARISON:  10/13/2023, 10/12/2023 FINDINGS: VASCULAR Aorta: Normal caliber aorta without aneurysm, dissection, vasculitis or significant stenosis. Diffuse atherosclerosis. Celiac: Patent without evidence of aneurysm, dissection, vasculitis or significant stenosis. SMA: Patent without evidence of aneurysm, dissection, vasculitis or significant stenosis. Renals: Both renal arteries are patent without evidence of aneurysm, dissection, vasculitis, fibromuscular dysplasia or significant stenosis. IMA: Patent without evidence of aneurysm, dissection, vasculitis or significant stenosis. Inflow: Patent without evidence of aneurysm, dissection, vasculitis or significant stenosis. Proximal Outflow: Bilateral common femoral and visualized portions of the superficial and profunda femoral arteries are patent without evidence of aneurysm, dissection, vasculitis or significant stenosis. Veins: No obvious venous abnormality within the limitations of this arterial phase study. Review of the MIP images confirms the above findings. NON-VASCULAR Lower chest: Small bilateral pleural effusions with dependent lower lobe atelectasis. Hepatobiliary: Multiple punctate calcified gallstones without evidence of acute cholecystitis. No biliary duct dilation or evidence of choledocholithiasis. The liver is unremarkable. Pancreas: Unremarkable. No pancreatic ductal dilatation or surrounding inflammatory changes. Spleen: Normal in size without focal abnormality. Adrenals/Urinary Tract: Kidneys enhance normally and symmetrically. No urinary tract calculi or obstruction. Bladder is unremarkable. The adrenals are stable. Stomach/Bowel: Wall thickening throughout the sigmoid colon again identified consistent with known sigmoid diverticulitis. Interval placement of a percutaneous pigtail drainage catheter anterior to the sacrum,  decompressing the presacral fluid collection. There are several other smaller fluid collections within the left mid abdomen, largest measuring 2.1 cm in size, decreased since the previous evaluation suggesting communication with the presacral abscess evacuated by the pigtail drainage catheter. Continued follow-up is recommended. No evidence  of high-grade bowel obstruction. Scattered gas fluid levels throughout nondilated loops of small bowel may suggest ileus. High attenuation retained oral contrast within the colon limits the evaluation for active gastrointestinal bleeding. There is no evidence of intraluminal contrast accumulation to suggest active gastrointestinal bleeding. There is marked mucosal hyperenhancement within the region of sigmoid diverticulitis described above. Lymphatic: No pathologic adenopathy. Reproductive: Uterus is atrophic. IUD again identified unchanged. No adnexal masses. Other: Trace pelvic free fluid. No free intraperitoneal gas. No abdominal wall hernia. Musculoskeletal: No acute or destructive bony abnormalities. Reconstructed images demonstrate no additional findings. IMPRESSION: VASCULAR 1. No evidence of active gastrointestinal bleeding. High attenuation stool/retained contrast within the distal colon does limit evaluation. 2.  Aortic Atherosclerosis (ICD10-I70.0). NON-VASCULAR 1. Sigmoid diverticulitis, with interval decrease in the diverticular abscesses seen on prior study after interval percutaneous pigtail drainage catheter placement. 2. Cholelithiasis without cholecystitis. 3. Small bilateral pleural effusions with dependent lower lobe atelectasis. 4. Scattered gas fluid levels throughout nondilated loops of small bowel, compatible with ileus. No evidence of high-grade obstruction. 5. Trace pelvic free fluid. Electronically Signed   By: Sharlet Salina M.D.   On: 10/15/2023 19:00   CT GUIDED PERITONEAL/RETROPERITONEAL FLUID DRAIN BY PERC CATH Result Date:  10/13/2023 INDICATION: 78 year old female with perforated diverticulitis and perirectal abscess. EXAM: CT-guided drain placement TECHNIQUE: Multidetector CT imaging of the low abdomen/pelvis was performed following the standard protocol without IV contrast. RADIATION DOSE REDUCTION: This exam was performed according to the departmental dose-optimization program which includes automated exposure control, adjustment of the mA and/or kV according to patient size and/or use of iterative reconstruction technique. MEDICATIONS: The patient is currently admitted to the hospital and receiving intravenous antibiotics. The antibiotics were administered within an appropriate time frame prior to the initiation of the procedure. ANESTHESIA/SEDATION: Moderate (conscious) sedation was employed during this procedure. A total of Versed 2 mg and Fentanyl 50 mcg was administered intravenously by the radiology nurse. Total intra-service moderate Sedation Time: 15 minutes. The patient's level of consciousness and vital signs were monitored continuously by radiology nursing throughout the procedure under my direct supervision. COMPLICATIONS: None immediate. PROCEDURE: Informed written consent was obtained from the patient after a thorough discussion of the procedural risks, benefits and alternatives. All questions were addressed. Maximal Sterile Barrier Technique was utilized including caps, mask, sterile gowns, sterile gloves, sterile drape, hand hygiene and skin antiseptic. A timeout was performed prior to the initiation of the procedure. Axial CT imaging was performed. The small abscess collection anterior to the sacrum and just superior to the rectum was successfully localized. A suitable skin entry site was selected and marked. Local anesthesia was attained by infiltration with 1% lidocaine. A small dermatotomy was made. Utilizing intermittent CT guidance, an 18 gauge trocar needle was carefully advanced along a right parasacral  approach into the fluid collection. A 0.035 wire was then coiled in the fluid collection. Percutaneous tract was dilated to 10 [place]. A skater 10 [place] all-purpose drainage catheter was advanced over the wire and formed. Aspiration yields 7 mL of frankly purulent fluid. Samples were sent for Gram stain and culture. The drainage catheter was gently flushed, connected to JP bulb suction and then secured to the skin with 0 Prolene suture. Follow-up CT imaging demonstrates a well-positioned drainage catheter with no evidence of complication. IMPRESSION: Successful placement of 10 French drainage catheter via a right transgluteal approach into the perirectal abscess. Electronically Signed   By: Malachy Moan M.D.   On: 10/13/2023 14:30    Labs:  CBC: Recent Labs    10/14/23 0511 10/15/23 0439 10/15/23 1159 10/16/23 0117 10/16/23 0711 10/16/23 1552 10/16/23 1848 10/17/23 0516  WBC 23.5* 17.3*  --  20.6*  --   --   --  19.4*  HGB 9.4* 6.5*   < > 8.8* 8.7* 8.9* 9.3* 9.3*  HCT 28.3* 19.6*   < > 25.3* 24.8* 26.1* 27.4* 27.4*  PLT 487* 448*  --  343  --   --   --  445*   < > = values in this interval not displayed.    COAGS: Recent Labs    10/13/23 0828  INR 1.3*    BMP: Recent Labs    10/14/23 0511 10/15/23 0439 10/16/23 0117 10/17/23 0516  NA 132* 134* 133* 133*  K 3.4* 3.8 3.2* 3.0*  CL 98 101 102 101  CO2 20* 26 22 22   GLUCOSE 99 114* 112* 97  BUN 12 36* 21 14  CALCIUM 7.8* 7.5* 7.1* 7.6*  CREATININE 0.50 0.47 0.38* <0.30*  GFRNONAA >60 >60 >60 NOT CALCULATED    LIVER FUNCTION TESTS: Recent Labs    10/12/23 1351  BILITOT 0.7  AST 16  ALT 13  ALKPHOS 74  PROT 5.9*  ALBUMIN 2.3*    Assessment and Plan: 78 y/o F admitted for perforated diverticulitis with abscess s/p right TG drain placement in IR 10/13/23 seen today for drain follow up; afebrile, WBC 19.4, hgb 9.3(9.3), K 3.0- replace; creat 0.30; drain fl cx- rare klebsiella, ecoli, bacteroides  Output by  Drain (mL) 10/15/23 0701 - 10/15/23 1900 10/15/23 1901 - 10/16/23 0700 10/16/23 0701 - 10/16/23 1900 10/16/23 1901 - 10/17/23 0700 10/17/23 0701 - 10/17/23 1104  Closed System Drain 1 Right;Anterior Hip  10 5 5     Cont with drain irrigation , close output monitoring; will plan f/u CT  today or tomorrow in view of minimal drain output; await GI input re: ? colonoscopy   Electronically Signed: D. Jeananne Rama, PA-C 10/17/2023, 10:53 AM   I spent a total of 15 Minutes at the the patient's bedside AND on the patient's hospital floor or unit, greater than 50% of which was counseling/coordinating care for pelvic abscess drain    Patient ID: Susan Fletcher, female   DOB: October 25, 1945, 78 y.o.   MRN: [id number]

## 2023-10-17 NOTE — Progress Notes (Signed)
 Mobility Specialist - Progress Note   10/17/23 1106  Mobility  Activity Ambulated independently in hallway  Level of Assistance Independent  Assistive Device None  Distance Ambulated (ft) 500 ft  Activity Response Tolerated well  Mobility Referral Yes  Mobility visit 1 Mobility  Mobility Specialist Start Time (ACUTE ONLY) 1054  Mobility Specialist Stop Time (ACUTE ONLY) 1105  Mobility Specialist Time Calculation (min) (ACUTE ONLY) 11 min   Pt received in bed and agreeable to mobility. No complaints during session. Pt to bed after session with all needs met.    [facility]

## 2023-10-17 NOTE — Progress Notes (Signed)
 Progress Note  1 Day Post-Op  Subjective: Pt denies abdominal pain, does report some pressure with walking but more able to mobilize than initially. Denies nausea or vomiting. Had some dark stools but no BRBPR. Drain not putting out much.   Objective: Vital signs in last 24 hours: Temp:  [97.1 F (36.2 C)-97.8 F (36.6 C)] 97.8 F (36.6 C) (04/01 0419) Pulse Rate:  [81-88] 86 (04/01 0419) Resp:  [15-21] 15 (04/01 0419) BP: (93-117)/(58-75) 105/61 (04/01 0419) SpO2:  [95 %-99 %] 96 % (04/01 0419) Last BM Date : 10/17/23  Intake/Output from previous day: 03/31 0701 - 04/01 0700 In: 1233.5 [P.O.:480; I.V.:250; IV Piggyback:493.5] Out: 10 [Drains:10] Intake/Output this shift: Total I/O In: 100 [IV Piggyback:100] Out: -   PE: General: pleasant, WD, thin female who is laying in bed in NAD Heart: regular, rate, and rhythm.   Lungs:  Respiratory effort nonlabored Abd: soft, NT, ND, drain with scant serous appearing fluid Psych: A&Ox3 with an appropriate affect.    Lab Results:  Recent Labs    10/16/23 0117 10/16/23 0711 10/16/23 1848 10/17/23 0516  WBC 20.6*  --   --  19.4*  HGB 8.8*   < > 9.3* 9.3*  HCT 25.3*   < > 27.4* 27.4*  PLT 343  --   --  445*   < > = values in this interval not displayed.   BMET Recent Labs    10/16/23 0117 10/17/23 0516  NA 133* 133*  K 3.2* 3.0*  CL 102 101  CO2 22 22  GLUCOSE 112* 97  BUN 21 14  CREATININE 0.38* <0.30*  CALCIUM 7.1* 7.6*   PT/INR No results for input(s): "LABPROT", "INR" in the last 72 hours. CMP     Component Value Date/Time   NA 133 (L) 10/17/2023 0516   NA 141 11/04/2021 1557   K 3.0 (L) 10/17/2023 0516   CL 101 10/17/2023 0516   CO2 22 10/17/2023 0516   GLUCOSE 97 10/17/2023 0516   BUN 14 10/17/2023 0516   BUN 21 11/04/2021 1557   CREATININE <0.30 (L) 10/17/2023 0516   CALCIUM 7.6 (L) 10/17/2023 0516   PROT 5.9 (L) 10/12/2023 1351   PROT 7.0 11/04/2021 1557   ALBUMIN 2.3 (L) 10/12/2023 1351    ALBUMIN 4.4 11/04/2021 1557   AST 16 10/12/2023 1351   ALT 13 10/12/2023 1351   ALKPHOS 74 10/12/2023 1351   BILITOT 0.7 10/12/2023 1351   BILITOT 0.9 11/04/2021 1557   GFRNONAA NOT CALCULATED 10/17/2023 0516   Lipase     Component Value Date/Time   LIPASE 25 10/12/2023 1351       Studies/Results: CT ANGIO GI BLEED Result Date: 10/15/2023 CLINICAL DATA:  Sigmoid diverticulitis with perirectal abscess status post percutaneous drain, concern for gastrointestinal bleeding EXAM: CTA ABDOMEN AND PELVIS WITHOUT AND WITH CONTRAST TECHNIQUE: Multidetector CT imaging of the abdomen and pelvis was performed using the standard protocol during bolus administration of intravenous contrast. Multiplanar reconstructed images and MIPs were obtained and reviewed to evaluate the vascular anatomy. RADIATION DOSE REDUCTION: This exam was performed according to the departmental dose-optimization program which includes automated exposure control, adjustment of the mA and/or kV according to patient size and/or use of iterative reconstruction technique. CONTRAST:  80mL OMNIPAQUE IOHEXOL 350 MG/ML SOLN COMPARISON:  10/13/2023, 10/12/2023 FINDINGS: VASCULAR Aorta: Normal caliber aorta without aneurysm, dissection, vasculitis or significant stenosis. Diffuse atherosclerosis. Celiac: Patent without evidence of aneurysm, dissection, vasculitis or significant stenosis. SMA: Patent without evidence of  aneurysm, dissection, vasculitis or significant stenosis. Renals: Both renal arteries are patent without evidence of aneurysm, dissection, vasculitis, fibromuscular dysplasia or significant stenosis. IMA: Patent without evidence of aneurysm, dissection, vasculitis or significant stenosis. Inflow: Patent without evidence of aneurysm, dissection, vasculitis or significant stenosis. Proximal Outflow: Bilateral common femoral and visualized portions of the superficial and profunda femoral arteries are patent without evidence of  aneurysm, dissection, vasculitis or significant stenosis. Veins: No obvious venous abnormality within the limitations of this arterial phase study. Review of the MIP images confirms the above findings. NON-VASCULAR Lower chest: Small bilateral pleural effusions with dependent lower lobe atelectasis. Hepatobiliary: Multiple punctate calcified gallstones without evidence of acute cholecystitis. No biliary duct dilation or evidence of choledocholithiasis. The liver is unremarkable. Pancreas: Unremarkable. No pancreatic ductal dilatation or surrounding inflammatory changes. Spleen: Normal in size without focal abnormality. Adrenals/Urinary Tract: Kidneys enhance normally and symmetrically. No urinary tract calculi or obstruction. Bladder is unremarkable. The adrenals are stable. Stomach/Bowel: Wall thickening throughout the sigmoid colon again identified consistent with known sigmoid diverticulitis. Interval placement of a percutaneous pigtail drainage catheter anterior to the sacrum, decompressing the presacral fluid collection. There are several other smaller fluid collections within the left mid abdomen, largest measuring 2.1 cm in size, decreased since the previous evaluation suggesting communication with the presacral abscess evacuated by the pigtail drainage catheter. Continued follow-up is recommended. No evidence of high-grade bowel obstruction. Scattered gas fluid levels throughout nondilated loops of small bowel may suggest ileus. High attenuation retained oral contrast within the colon limits the evaluation for active gastrointestinal bleeding. There is no evidence of intraluminal contrast accumulation to suggest active gastrointestinal bleeding. There is marked mucosal hyperenhancement within the region of sigmoid diverticulitis described above. Lymphatic: No pathologic adenopathy. Reproductive: Uterus is atrophic. IUD again identified unchanged. No adnexal masses. Other: Trace pelvic free fluid. No free  intraperitoneal gas. No abdominal wall hernia. Musculoskeletal: No acute or destructive bony abnormalities. Reconstructed images demonstrate no additional findings. IMPRESSION: VASCULAR 1. No evidence of active gastrointestinal bleeding. High attenuation stool/retained contrast within the distal colon does limit evaluation. 2.  Aortic Atherosclerosis (ICD10-I70.0). NON-VASCULAR 1. Sigmoid diverticulitis, with interval decrease in the diverticular abscesses seen on prior study after interval percutaneous pigtail drainage catheter placement. 2. Cholelithiasis without cholecystitis. 3. Small bilateral pleural effusions with dependent lower lobe atelectasis. 4. Scattered gas fluid levels throughout nondilated loops of small bowel, compatible with ileus. No evidence of high-grade obstruction. 5. Trace pelvic free fluid. Electronically Signed   By: Sharlet Salina M.D.   On: 10/15/2023 19:00    Anti-infectives: Anti-infectives (From admission, onward)    Start     Dose/Rate Route Frequency Ordered Stop   10/16/23 1230  Ampicillin-Sulbactam (UNASYN) 3 g in sodium chloride 0.9 % 100 mL IVPB        3 g 200 mL/hr over 30 Minutes Intravenous Every 6 hours 10/16/23 1134     10/13/23 0200  piperacillin-tazobactam (ZOSYN) IVPB 3.375 g  Status:  Discontinued        3.375 g 12.5 mL/hr over 240 Minutes Intravenous Every 8 hours 10/12/23 2040 10/16/23 1134   10/12/23 1845  piperacillin-tazobactam (ZOSYN) IVPB 3.375 g        3.375 g 100 mL/hr over 30 Minutes Intravenous  Once 10/12/23 1835 10/12/23 2042        Assessment/Plan  Sigmoid diverticulitis with multiple abscesses - No current indication for emergency surgery and no s/p IR drain 3/28 - Cx with Klebsiella pneumoniae resistant to ampicillin - drain  with 10 cc out/24 hrs for the last 72 hrs will ask IR to eval  - continue abx - ok to advance to low fiber diet from surgical standpoint   Stable from a surgical standpoint.  No acute need for surgery.     GI bleeding - hgb down to 5.3, s/p 2 PRBC. Hgb now stabilized. EGD negative for acute source of bleeding. Suspect upper GI source given dark tarry stools. Would recommend holding off on colonoscopy acutely in setting of diverticulitis with perforation and abscess - await further input from GI    FEN: NPO per GI ID: zosyn 3/27>3/31; Unasyn 3/31>> VTE: held in setting of anemia   LOS: 5 days   I reviewed Consultant GI and IR notes, hospitalist notes, last 24 h vitals and pain scores, last 48 h intake and output, last 24 h labs and trends, and last 24 h imaging results.  This care required moderate level of medical decision making.    Juliet Rude, [facility] 10/17/2023, 9:25 AM Please see Amion for pager number during day hours 7:00am-4:30pm

## 2023-10-18 ENCOUNTER — Inpatient Hospital Stay (HOSPITAL_COMMUNITY)

## 2023-10-18 DIAGNOSIS — K5720 Diverticulitis of large intestine with perforation and abscess without bleeding: Secondary | ICD-10-CM | POA: Diagnosis not present

## 2023-10-18 HISTORY — PX: IR SINUS/FIST TUBE CHK-NON GI: IMG673

## 2023-10-18 LAB — BASIC METABOLIC PANEL WITH GFR
Anion gap: 8 (ref 5–15)
BUN: 11 mg/dL (ref 8–23)
CO2: 24 mmol/L (ref 22–32)
Calcium: 7.5 mg/dL — ABNORMAL LOW (ref 8.9–10.3)
Chloride: 102 mmol/L (ref 98–111)
Creatinine, Ser: 0.32 mg/dL — ABNORMAL LOW (ref 0.44–1.00)
GFR, Estimated: >60 mL/min (ref >60)
Glucose, Bld: 101 mg/dL — ABNORMAL HIGH (ref 70–99)
Potassium: 3.6 mmol/L (ref 3.5–5.1)
Sodium: 134 mmol/L — ABNORMAL LOW (ref 135–145)

## 2023-10-18 LAB — CBC
HCT: 25.7 % — ABNORMAL LOW (ref 36.0–46.0)
Hemoglobin: 8.7 g/dL — ABNORMAL LOW (ref 12.0–15.0)
MCH: 30.7 pg (ref 26.0–34.0)
MCHC: 33.9 g/dL (ref 30.0–36.0)
MCV: 90.8 fL (ref 80.0–100.0)
Platelets: 501 10*3/uL — ABNORMAL HIGH (ref 150–400)
RBC: 2.83 MIL/uL — ABNORMAL LOW (ref 3.87–5.11)
RDW: 14.8 % (ref 11.5–15.5)
WBC: 17.8 10*3/uL — ABNORMAL HIGH (ref 4.0–10.5)
nRBC: 0 % (ref 0.0–0.2)

## 2023-10-18 LAB — PHOSPHORUS: Phosphorus: 2.3 mg/dL — ABNORMAL LOW (ref 2.5–4.6)

## 2023-10-18 LAB — MAGNESIUM: Magnesium: 2.2 mg/dL (ref 1.7–2.4)

## 2023-10-18 MED ORDER — IOHEXOL 300 MG/ML  SOLN
100.0000 mL | Freq: Once | INTRAMUSCULAR | Status: AC | PRN
  Administered 2023-10-18: 100 mL via INTRAVENOUS

## 2023-10-18 MED ORDER — IOHEXOL 300 MG/ML  SOLN
50.0000 mL | Freq: Once | INTRAMUSCULAR | Status: AC | PRN
Start: 2023-10-18 — End: 2023-10-18
  Administered 2023-10-18: 10 mL

## 2023-10-18 MED ORDER — K PHOS MONO-SOD PHOS DI & MONO 155-852-130 MG PO TABS
500.0000 mg | ORAL_TABLET | Freq: Three times a day (TID) | ORAL | Status: AC
  Administered 2023-10-18 (×3): 500 mg via ORAL
  Filled 2023-10-18 (×3): qty 2

## 2023-10-18 NOTE — Progress Notes (Signed)
 Mobility Specialist - Progress Note   10/18/23 1004  Mobility  Activity Ambulated independently in hallway  Level of Assistance Independent  Assistive Device None  Distance Ambulated (ft) 500 ft  Activity Response Tolerated well  Mobility Referral Yes  Mobility visit 1 Mobility  Mobility Specialist Start Time (ACUTE ONLY) 0951  Mobility Specialist Stop Time (ACUTE ONLY) 1000  Mobility Specialist Time Calculation (min) (ACUTE ONLY) 9 min   Pt received in bed and agreeable to mobility. No complaints during session. Pt to bed after session with all needs met.    [facility]

## 2023-10-18 NOTE — Progress Notes (Signed)
 PROGRESS NOTE  Susan Fletcher  ION:[id number] DOB: August 29, 1945 DOA: 10/12/2023 PCP: Patient, No Pcp Per   Brief Narrative: Patient is a 78 year old female with no significant past medical history who presented with complaint of abdominal pain, associated chills, diaphoresis.  On palpation she was hemodynamically stable.  Lab work showed elevated leukocytes, potassium of 3.1.  CT abdomen/pelvis showed severe sigmoid diverticulitis with multiple pericolonic abscess.  General surgery consulted and following.  Plan for drain removal today.  Possible discharge in a.m.  Assessment & Plan:  Principal Problem:   Abscess of sigmoid colon due to diverticulitis Active Problems:   Hypokalemia   Hyponatremia   Normocytic anemia   Melena  Sigmoid diverticulitis/pericolonic abscess: Presented with severe abdominal pain.  CT abdomen/pelvis as above.  Concern for partial colonic obstruction.  General surgery, IR consulted.  Currently on Unasyn.  IR placed a drain on 3/28 .cultures showed Klebsiella pneumonia.Ecoli, moderate bacteroides.  Will change  antibiotic to Augmentin on discharge Currently on soft diet.  IR reconsulted for possible drain removal tomorrow.  No need for further surgical intervention.  Leukocytosis improving  GI bleed: Progressive anemia during this hospitalization, hemoglobin was down to 5.3, given 2 units of PRBC.  FOBT was positive.  CTA did not show any evidence of active GI bleed.  Currently hemoglobin stabilized.  EGD negative for acute source of bleeding.  No colonoscopy planned for now.  Probably will need in 6 weeks after inflammation is resolved.  Continue Protonix  Hypokalemia/hypomagnesemia/hypophosphatemia: Currently being monitored and supplemented  Hyponatremia: Continue to monitor.  Currently stable         DVT prophylaxis:SCDs Start: 10/12/23 1939     Code Status: Full Code  Family Communication: None at bedside  Patient status:Inpatient  Patient is from  :Home  Anticipated discharge KG:[facility]  Estimated DC date: After general surgery clearance,tomorrow   Consultants: General Surgery, GI  Procedures: EGD, drain placement  Antimicrobials:  Anti-infectives (From admission, onward)    Start     Dose/Rate Route Frequency Ordered Stop   10/16/23 1230  Ampicillin-Sulbactam (UNASYN) 3 g in sodium chloride 0.9 % 100 mL IVPB        3 g 200 mL/hr over 30 Minutes Intravenous Every 6 hours 10/16/23 1134     10/13/23 0200  piperacillin-tazobactam (ZOSYN) IVPB 3.375 g  Status:  Discontinued        3.375 g 12.5 mL/hr over 240 Minutes Intravenous Every 8 hours 10/12/23 2040 10/16/23 1134   10/12/23 1845  piperacillin-tazobactam (ZOSYN) IVPB 3.375 g        3.375 g 100 mL/hr over 30 Minutes Intravenous  Once 10/12/23 1835 10/12/23 2042       Subjective: Patient seen and examined at the bedside today.  Hemodynamically stable.  Comfortable.  Ambulating inside the room.  She is having multiple episodes of diarrhea.  She is tolerating food, no nausea, vomiting or abdominal pain.  Objective: Vitals:   10/17/23 1140 10/17/23 1324 10/17/23 2033 10/18/23 0610  BP: (!) 96/56 100/64 (!) 96/51 111/71  Pulse: 84  93 82  Resp: 16   16  Temp: 98.1 F (36.7 C)  98.9 F (37.2 C) 98.2 F (36.8 C)  TempSrc: Oral  Oral Oral  SpO2: 96%  97% 97%  Weight:      Height:        Intake/Output Summary (Last 24 hours) at 10/18/2023 1319 Last data filed at 10/18/2023 1226 Gross per 24 hour  Intake 1257.19 ml  Output 10 ml  Net 1247.19 ml   Filed Weights   10/12/23 1314  Weight: 40.6 kg    Examination:  General exam: Overall comfortable, not in distress, very pleasant elderly female HEENT: PERRL Respiratory system:  no wheezes or crackles  Cardiovascular system: S1 & S2 heard, RRR.  Gastrointestinal system: Abdomen is nondistended, soft and nontender.  Abdominal drain Central nervous system: Alert and oriented Extremities: No edema, no clubbing ,no  cyanosis Skin: No rashes, no ulcers,no icterus     Data Reviewed: I have personally reviewed following labs and imaging studies  CBC: Recent Labs  Lab 10/14/23 0511 10/15/23 0439 10/15/23 1159 10/16/23 0117 10/16/23 0711 10/16/23 1552 10/16/23 1848 10/17/23 0516 10/18/23 0504  WBC 23.5* 17.3*  --  20.6*  --   --   --  19.4* 17.8*  HGB 9.4* 6.5*   < > 8.8* 8.7* 8.9* 9.3* 9.3* 8.7*  HCT 28.3* 19.6*   < > 25.3* 24.8* 26.1* 27.4* 27.4* 25.7*  MCV 94.6 92.9  --  87.8  --   --   --  91.0 90.8  PLT 487* 448*  --  343  --   --   --  445* 501*   < > = values in this interval not displayed.   Basic Metabolic Panel: Recent Labs  Lab 10/14/23 0511 10/15/23 0439 10/16/23 0117 10/17/23 0516 10/18/23 0504  NA 132* 134* 133* 133* 134*  K 3.4* 3.8 3.2* 3.0* 3.6  CL 98 101 102 101 102  CO2 20* 26 22 22 24   GLUCOSE 99 114* 112* 97 101*  BUN 12 36* 21 14 11   CREATININE 0.50 0.47 0.38* <0.30* 0.32*  CALCIUM 7.8* 7.5* 7.1* 7.6* 7.5*  MG 1.7 1.9  --  1.8 2.2  PHOS 2.2* 1.5*  --  2.5 2.3*     Recent Results (from the past 240 hours)  Blood culture (routine x 2)     Status: None   Collection Time: 10/12/23  4:40 PM   Specimen: BLOOD  Result Value Ref Range Status   Specimen Description   Final    BLOOD SITE NOT SPECIFIED Performed at [facility], [address], [place], [place] [address]    Special Requests   Final    BOTTLES DRAWN AEROBIC AND ANAEROBIC Blood Culture results may not be optimal due to an inadequate volume of blood received in culture bottles Performed at [facility], [address], [place], [place] [address]    Culture   Final    NO GROWTH 5 DAYS Performed at [facility], [address], [place], [place] [address]    Report Status 10/17/2023 FINAL  Final  Blood culture (routine x 2)     Status: None   Collection Time: 10/12/23  4:43 PM   Specimen: BLOOD  Result Value Ref Range Status   Specimen Description    Final    BLOOD SITE NOT SPECIFIED Performed at [facility], [address], [place], [place] [address]    Special Requests   Final    BOTTLES DRAWN AEROBIC AND ANAEROBIC Blood Culture results may not be optimal due to an inadequate volume of blood received in culture bottles Performed at [facility], [address], [place], [place] [address]    Culture   Final    NO GROWTH 5 DAYS Performed at [facility], [address], [place], [place] [address]    Report Status 10/17/2023 FINAL  Final  Aerobic/Anaerobic Culture w Gram Stain (surgical/deep wound)     Status: None   Collection Time: 10/13/23  2:31 PM   Specimen: Abscess  Result Value Ref Range Status   Specimen Description   Final    ABSCESS Performed at [facility], [address], [place], [place] [address]    Special Requests   Final    NONE Performed at [facility], [address] [place], [place] [address]    Gram Stain   Final    ABUNDANT WBC PRESENT, PREDOMINANTLY PMN FEW GRAM NEGATIVE RODS    Culture   Final    RARE KLEBSIELLA PNEUMONIAE RARE ESCHERICHIA COLI MODERATE BACTEROIDES THETAIOTAOMICRON BETA LACTAMASE NEGATIVE Performed at [facility], [address] [place], [place] [address]    Report Status 10/17/2023 FINAL  Final   Organism ID, Bacteria KLEBSIELLA PNEUMONIAE  Final   Organism ID, Bacteria ESCHERICHIA COLI  Final      Susceptibility   Escherichia coli - MIC*    AMPICILLIN 4 SENSITIVE Sensitive     CEFEPIME <=0.12 SENSITIVE Sensitive     CEFTAZIDIME <=1 SENSITIVE Sensitive     CEFTRIAXONE <=0.25 SENSITIVE Sensitive     CIPROFLOXACIN <=0.25 SENSITIVE Sensitive     GENTAMICIN <=1 SENSITIVE Sensitive     IMIPENEM <=0.25 SENSITIVE Sensitive     TRIMETH/SULFA <=20 SENSITIVE Sensitive     AMPICILLIN/SULBACTAM <=2 SENSITIVE Sensitive     PIP/TAZO <=4 SENSITIVE Sensitive ug/mL    * RARE ESCHERICHIA COLI    Klebsiella pneumoniae - MIC*    AMPICILLIN >=32 RESISTANT Resistant     CEFEPIME <=0.12 SENSITIVE Sensitive     CEFTAZIDIME <=1 SENSITIVE Sensitive     CEFTRIAXONE <=0.25 SENSITIVE Sensitive     CIPROFLOXACIN <=0.25 SENSITIVE Sensitive     GENTAMICIN <=1 SENSITIVE Sensitive     IMIPENEM <=0.25 SENSITIVE Sensitive     TRIMETH/SULFA <=20 SENSITIVE Sensitive     AMPICILLIN/SULBACTAM 4 SENSITIVE Sensitive     PIP/TAZO <=4 SENSITIVE Sensitive ug/mL    * RARE KLEBSIELLA PNEUMONIAE     Radiology Studies: No results found.  Scheduled Meds:  folic acid  1 mg Oral Daily   pantoprazole  40 mg Oral Daily   phosphorus  500 mg Oral TID   sodium chloride flush  5 mL Intracatheter Q8H   Continuous Infusions:  ampicillin-sulbactam (UNASYN) IV Stopped (10/18/23 1217)     LOS: 6 days   Burnadette Pop, MD [facility] P4/08/2023, 1:19 PM

## 2023-10-18 NOTE — Progress Notes (Signed)
 Progress Note  2 Days Post-Op  Subjective: Having bowel movements and passing small amounts of gas - still feeling bloated. No n/v or abdominal pain and tolerating soft diet. Dark stool seem to be lightening/improving  Objective: Vital signs in last 24 hours: Temp:  [98.1 F (36.7 C)-98.9 F (37.2 C)] 98.2 F (36.8 C) (04/02 0610) Pulse Rate:  [82-93] 82 (04/02 0610) Resp:  [16] 16 (04/02 0610) BP: (96-111)/(51-71) 111/71 (04/02 0610) SpO2:  [96 %-97 %] 97 % (04/02 0610) Last BM Date : 10/18/23  Intake/Output from previous day: 04/01 0701 - 04/02 0700 In: 1471.5 [P.O.:480; I.V.:15; IV Piggyback:961.5] Out: 10 [Drains:10] Intake/Output this shift: Total I/O In: 115.7 [IV Piggyback:115.7] Out: -   PE: General: pleasant, WD, thin female who is laying in bed in NAD Heart: regular, rate, and rhythm.   Lungs:  Respiratory effort nonlabored Abd: soft, NT, ND, drain empty Psych: A&Ox3 with an appropriate affect.    Lab Results:  Recent Labs    10/17/23 0516 10/18/23 0504  WBC 19.4* 17.8*  HGB 9.3* 8.7*  HCT 27.4* 25.7*  PLT 445* 501*   BMET Recent Labs    10/17/23 0516 10/18/23 0504  NA 133* 134*  K 3.0* 3.6  CL 101 102  CO2 22 24  GLUCOSE 97 101*  BUN 14 11  CREATININE <0.30* 0.32*  CALCIUM 7.6* 7.5*   PT/INR No results for input(s): "LABPROT", "INR" in the last 72 hours. CMP     Component Value Date/Time   NA 134 (L) 10/18/2023 0504   NA 141 11/04/2021 1557   K 3.6 10/18/2023 0504   CL 102 10/18/2023 0504   CO2 24 10/18/2023 0504   GLUCOSE 101 (H) 10/18/2023 0504   BUN 11 10/18/2023 0504   BUN 21 11/04/2021 1557   CREATININE 0.32 (L) 10/18/2023 0504   CALCIUM 7.5 (L) 10/18/2023 0504   PROT 5.9 (L) 10/12/2023 1351   PROT 7.0 11/04/2021 1557   ALBUMIN 2.3 (L) 10/12/2023 1351   ALBUMIN 4.4 11/04/2021 1557   AST 16 10/12/2023 1351   ALT 13 10/12/2023 1351   ALKPHOS 74 10/12/2023 1351   BILITOT 0.7 10/12/2023 1351   BILITOT 0.9 11/04/2021  1557   GFRNONAA >60 10/18/2023 0504   Lipase     Component Value Date/Time   LIPASE 25 10/12/2023 1351       Studies/Results: No results found.   Anti-infectives: Anti-infectives (From admission, onward)    Start     Dose/Rate Route Frequency Ordered Stop   10/16/23 1230  Ampicillin-Sulbactam (UNASYN) 3 g in sodium chloride 0.9 % 100 mL IVPB        3 g 200 mL/hr over 30 Minutes Intravenous Every 6 hours 10/16/23 1134     10/13/23 0200  piperacillin-tazobactam (ZOSYN) IVPB 3.375 g  Status:  Discontinued        3.375 g 12.5 mL/hr over 240 Minutes Intravenous Every 8 hours 10/12/23 2040 10/16/23 1134   10/12/23 1845  piperacillin-tazobactam (ZOSYN) IVPB 3.375 g        3.375 g 100 mL/hr over 30 Minutes Intravenous  Once 10/12/23 1835 10/12/23 2042        Assessment/Plan  Sigmoid diverticulitis with multiple abscesses - No current indication for emergency surgery and no s/p IR drain 3/28 - Cx with Klebsiella pneumoniae resistant to ampicillin and pan sensitive e coli - CT per IR today pending for possible drain removal - continue abx - tol soft diet   Stable from a surgical  standpoint.  No acute need for surgery.    GI bleeding - hgb down to 5.3, s/p 2 PRBC. Hgb now stabilized. EGD negative for acute source of bleeding. Suspect upper GI source given dark tarry stools. GI following. No c scope planned imminently and will need in approx 6+ weeks once acute inflammation resolved   FEN: soft ID: zosyn 3/27>3/31; Unasyn 3/31>> VTE: held in setting of anemia   LOS: 6 days   I reviewed Consultant GI and IR notes, hospitalist notes, last 24 h vitals and pain scores, last 48 h intake and output, last 24 h labs and trends, and last 24 h imaging results.     Eric Form, [facility] 10/18/2023, 9:33 AM Please see Amion for pager number during day hours 7:00am-4:30pm

## 2023-10-19 ENCOUNTER — Other Ambulatory Visit (HOSPITAL_COMMUNITY): Payer: Self-pay

## 2023-10-19 DIAGNOSIS — K5720 Diverticulitis of large intestine with perforation and abscess without bleeding: Secondary | ICD-10-CM | POA: Diagnosis not present

## 2023-10-19 LAB — BASIC METABOLIC PANEL WITH GFR
Anion gap: 9 (ref 5–15)
BUN: 11 mg/dL (ref 8–23)
CO2: 25 mmol/L (ref 22–32)
Calcium: 7.7 mg/dL — ABNORMAL LOW (ref 8.9–10.3)
Chloride: 99 mmol/L (ref 98–111)
Creatinine, Ser: 0.4 mg/dL — ABNORMAL LOW (ref 0.44–1.00)
GFR, Estimated: >60 mL/min (ref >60)
Glucose, Bld: 104 mg/dL — ABNORMAL HIGH (ref 70–99)
Potassium: 3.2 mmol/L — ABNORMAL LOW (ref 3.5–5.1)
Sodium: 133 mmol/L — ABNORMAL LOW (ref 135–145)

## 2023-10-19 LAB — CBC
HCT: 29.4 % — ABNORMAL LOW (ref 36.0–46.0)
Hemoglobin: 9.6 g/dL — ABNORMAL LOW (ref 12.0–15.0)
MCH: 30.6 pg (ref 26.0–34.0)
MCHC: 32.7 g/dL (ref 30.0–36.0)
MCV: 93.6 fL (ref 80.0–100.0)
Platelets: 588 10*3/uL — ABNORMAL HIGH (ref 150–400)
RBC: 3.14 MIL/uL — ABNORMAL LOW (ref 3.87–5.11)
RDW: 14.8 % (ref 11.5–15.5)
WBC: 15.4 10*3/uL — ABNORMAL HIGH (ref 4.0–10.5)
nRBC: 0 % (ref 0.0–0.2)

## 2023-10-19 LAB — PHOSPHORUS: Phosphorus: 3.5 mg/dL (ref 2.5–4.6)

## 2023-10-19 MED ORDER — PANTOPRAZOLE SODIUM 40 MG PO TBEC
40.0000 mg | DELAYED_RELEASE_TABLET | Freq: Every day | ORAL | 0 refills | Status: AC
  Filled 2023-10-19: qty 30, 30d supply, fill #0

## 2023-10-19 MED ORDER — AMOXICILLIN-POT CLAVULANATE 875-125 MG PO TABS
1.0000 | ORAL_TABLET | Freq: Two times a day (BID) | ORAL | Status: DC
  Administered 2023-10-19: 1 via ORAL
  Filled 2023-10-19: qty 1

## 2023-10-19 MED ORDER — PANTOPRAZOLE SODIUM 40 MG PO TBEC
40.0000 mg | DELAYED_RELEASE_TABLET | Freq: Every day | ORAL | 0 refills | Status: DC
  Filled 2023-10-19: qty 14, 14d supply, fill #0

## 2023-10-19 MED ORDER — POTASSIUM CHLORIDE CRYS ER 20 MEQ PO TBCR
40.0000 meq | EXTENDED_RELEASE_TABLET | Freq: Once | ORAL | Status: AC
Start: 2023-10-19 — End: 2023-10-19
  Administered 2023-10-19: 40 meq via ORAL
  Filled 2023-10-19: qty 2

## 2023-10-19 MED ORDER — AMOXICILLIN-POT CLAVULANATE 875-125 MG PO TABS
1.0000 | ORAL_TABLET | Freq: Two times a day (BID) | ORAL | 0 refills | Status: AC
  Filled 2023-10-19: qty 16, 8d supply, fill #0

## 2023-10-19 MED ORDER — POTASSIUM CHLORIDE CRYS ER 20 MEQ PO TBCR
40.0000 meq | EXTENDED_RELEASE_TABLET | Freq: Every day | ORAL | 0 refills | Status: AC
  Filled 2023-10-19: qty 6, 3d supply, fill #0

## 2023-10-19 NOTE — Progress Notes (Addendum)
 Progress Note  3 Days Post-Op  Subjective: Already ambulated in hallway this am. She is having some ongoing loose bowel movements but feels they are improving. Drain removed. No abdominal pain, n/v  Objective: Vital signs in last 24 hours: Temp:  [97.8 F (36.6 C)-98 F (36.7 C)] 98 F (36.7 C) (04/03 0534) Pulse Rate:  [82-95] 82 (04/03 0534) Resp:  [18-20] 20 (04/03 0534) BP: (110-124)/(68-74) 124/74 (04/03 0534) SpO2:  [97 %-99 %] 98 % (04/03 0534) Last BM Date : 10/18/23  Intake/Output from previous day: 04/02 0701 - 04/03 0700 In: 1240.7 [P.O.:720; IV Piggyback:515.7] Out: -  Intake/Output this shift: No intake/output data recorded.  PE: General: pleasant, WD, thin female who is sitting up in bed in NAD Lungs:  Respiratory effort nonlabored Abd: soft, NT, ND, drain site with bandage cdi Psych: A&Ox3 with an appropriate affect.    Lab Results:  Recent Labs    10/18/23 0504 10/19/23 0459  WBC 17.8* 15.4*  HGB 8.7* 9.6*  HCT 25.7* 29.4*  PLT 501* 588*   BMET Recent Labs    10/18/23 0504 10/19/23 0459  NA 134* 133*  K 3.6 3.2*  CL 102 99  CO2 24 25  GLUCOSE 101* 104*  BUN 11 11  CREATININE 0.32* 0.40*  CALCIUM 7.5* 7.7*   PT/INR No results for input(s): "LABPROT", "INR" in the last 72 hours. CMP     Component Value Date/Time   NA 133 (L) 10/19/2023 0459   NA 141 11/04/2021 1557   K 3.2 (L) 10/19/2023 0459   CL 99 10/19/2023 0459   CO2 25 10/19/2023 0459   GLUCOSE 104 (H) 10/19/2023 0459   BUN 11 10/19/2023 0459   BUN 21 11/04/2021 1557   CREATININE 0.40 (L) 10/19/2023 0459   CALCIUM 7.7 (L) 10/19/2023 0459   PROT 5.9 (L) 10/12/2023 1351   PROT 7.0 11/04/2021 1557   ALBUMIN 2.3 (L) 10/12/2023 1351   ALBUMIN 4.4 11/04/2021 1557   AST 16 10/12/2023 1351   ALT 13 10/12/2023 1351   ALKPHOS 74 10/12/2023 1351   BILITOT 0.7 10/12/2023 1351   BILITOT 0.9 11/04/2021 1557   GFRNONAA >60 10/19/2023 0459   Lipase     Component Value  Date/Time   LIPASE 25 10/12/2023 1351       Studies/Results: IR Sinus/Fist Tube Chk-Non GI Result Date: 10/18/2023 CLINICAL DATA:  Sigmoid diverticulitis with dominant pelvic abscess, status post right transgluteal drain catheter placement 10/13/2023. Drainage has tapered off. CT earlier today shows no significant residual undrained fluid. EXAM: ABSCESS DRAIN CATHETER INJECTION UNDER FLUOROSCOPY TECHNIQUE: The procedure, risks (including but not limited to bleeding, infection, organ damage), benefits, and alternatives were explained to the patient. Questions regarding the procedure were encouraged and answered. The patient understands and consents to the procedure. Survey fluoroscopic inspection reveals stable position of right transgluteal pigtail drain catheter. Injection demonstrates no fistula. Distension of a small residual abscess cavity, the contrast completely evacuated with aspiration. Some contrast leaks along the catheter shaft into the gluteal musculature. Given the lack of residual undrained fluid, and absence of demonstrated fistula to bowel, catheter removal is indicated. Catheter was cut and removed in its entirety without complication, and a sterile dressing applied to the site. The patient tolerated the procedure well. IMPRESSION: 1. Resolution of pelvic abscess. No fistula to bowel. Drain catheter removed. Electronically Signed   By: Corlis Leak M.D.   On: 10/18/2023 19:09   CT ABDOMEN PELVIS W CONTRAST Result Date: 10/18/2023 CLINICAL DATA:  Status post drainage catheter placement for pelvic abscess. EXAM: CT ABDOMEN AND PELVIS WITH CONTRAST TECHNIQUE: Multidetector CT imaging of the abdomen and pelvis was performed using the standard protocol following bolus administration of intravenous contrast. RADIATION DOSE REDUCTION: This exam was performed according to the departmental dose-optimization program which includes automated exposure control, adjustment of the mA and/or kV according to  patient size and/or use of iterative reconstruction technique. CONTRAST:  OMNIPAQUE IOHEXOL 300 MG/ML  SOLN COMPARISON:  October 15, 2023. FINDINGS: Lower chest: Small bilateral pleural effusions are noted with minimal adjacent subsegmental atelectasis. Hepatobiliary: Hepatic steatosis. Cholelithiasis. No significant biliary dilatation is noted. Pancreas: Unremarkable. No pancreatic ductal dilatation or surrounding inflammatory changes. Spleen: Normal in size without focal abnormality. Adrenals/Urinary Tract: Adrenal glands appear normal. No hydronephrosis or renal obstruction is noted. Urinary bladder is decompressed. Stomach/Bowel: Stomach is unremarkable. Continued wall thickening of sigmoid colon is noted consistent with diverticulitis. Right transgluteal pelvic drainage catheter is again noted. No significant residual fluid collection remains. Mild dilatation of more proximal colon is noted which may represent ileus. Vascular/Lymphatic: Aortic atherosclerosis. No enlarged abdominal or pelvic lymph nodes. Reproductive: Intrauterine device is again noted. No definite adnexal abnormality is noted. Other: No hernia is noted.  Minimal ascites is noted. Musculoskeletal: No acute or significant osseous findings. IMPRESSION: Stable position of right transgluteal pelvic drainage catheter. No significant residual fluid collection remains. Continued wall thickening of sigmoid colon is noted consistent with diverticulitis. Mild dilatation of more proximal colon is noted which may represent ileus. Minimal ascites. Hepatic steatosis and cholelithiasis. Small bilateral pleural effusions are noted with minimal adjacent subsegmental atelectasis. Aortic Atherosclerosis (ICD10-I70.0). Electronically Signed   By: Lupita Raider M.D.   On: 10/18/2023 13:21     Anti-infectives: Anti-infectives (From admission, onward)    Start     Dose/Rate Route Frequency Ordered Stop   10/16/23 1230  Ampicillin-Sulbactam (UNASYN) 3 g  in sodium chloride 0.9 % 100 mL IVPB        3 g 200 mL/hr over 30 Minutes Intravenous Every 6 hours 10/16/23 1134     10/13/23 0200  piperacillin-tazobactam (ZOSYN) IVPB 3.375 g  Status:  Discontinued        3.375 g 12.5 mL/hr over 240 Minutes Intravenous Every 8 hours 10/12/23 2040 10/16/23 1134   10/12/23 1845  piperacillin-tazobactam (ZOSYN) IVPB 3.375 g        3.375 g 100 mL/hr over 30 Minutes Intravenous  Once 10/12/23 1835 10/12/23 2042        Assessment/Plan  Sigmoid diverticulitis with multiple abscesses - No current indication for emergency surgery and no s/p IR drain 3/28 - Cx with Klebsiella pneumoniae resistant to ampicillin and pan sensitive e coli - CT per IR 4/2 and drained removed. Imaging showed resolution of fluid collection and ongoing inflammatory changes  - continue abx - tol soft diet  GI bleeding - hgb down to 5.3, s/p 2 PRBC. Hgb now stabilized. EGD negative for acute source of bleeding. Suspect upper GI source given dark tarry stools. GI following. No c scope planned imminently and will need in approx 6+ weeks once acute inflammation resolved   Stable for discharge from a surgical standpoint on antibiotics to complete 14 days total.  No acute need for surgery.  She should follow up with GI for c scope in 6-8 weeks and with general surgery after  FEN: soft ID: zosyn 3/27>3/31; Unasyn 3/31>> VTE: held in setting of anemia   LOS: 7 days   I  reviewed Consultant GI and IR notes, hospitalist notes, last 24 h vitals and pain scores, last 48 h intake and output, last 24 h labs and trends, and last 24 h imaging results.     Eric Form, [facility] 10/19/2023, 9:37 AM Please see Amion for pager number during day hours 7:00am-4:30pm

## 2023-10-19 NOTE — TOC Transition Note (Signed)
 Transition of Care [facility]) - Discharge Note   Patient Details  Name: Susan Fletcher MRN: [id number] Date of Birth: [birth year]  [facility] CM/SW Contact:  Lanier Clam, RN Phone Number: 10/19/2023, 10:53 AM   Clinical Narrative:d/c home. No further CM needs.       Final next level of care: Home/Self Care Barriers to Discharge: No Barriers Identified   Patient Goals and CMS Choice Patient states their goals for this hospitalization and ongoing recovery are:: Home CMS Medicare.gov Compare Post Acute Care list provided to:: Patient Choice offered to / list presented to : Patient      Discharge Placement                       Discharge Plan and Services Additional resources added to the After Visit Summary for   In-house Referral: NA Discharge Planning Services: CM Consult                                 Social Drivers of Health (SDOH) Interventions SDOH Screenings   Food Insecurity: No Food Insecurity (10/12/2023)  Housing: Unknown (10/12/2023)  Transportation Needs: No Transportation Needs (10/12/2023)  Utilities: Not At Risk (10/12/2023)  Depression (PHQ2-9): Low Risk  (11/04/2021)  Social Connections: Moderately Integrated (10/12/2023)  Tobacco Use: Medium Risk (10/16/2023)     Readmission Risk Interventions     No data to display

## 2023-10-19 NOTE — Progress Notes (Signed)
 Mobility Specialist - Progress Note   10/19/23 0923  Mobility  Activity Ambulated independently in hallway;Ambulated independently to bathroom  Level of Assistance Independent  Assistive Device None  Distance Ambulated (ft) 500 ft  Activity Response Tolerated well  Mobility Referral Yes  Mobility visit 1 Mobility  Mobility Specialist Start Time (ACUTE ONLY) 0908  Mobility Specialist Stop Time (ACUTE ONLY) [id number]  Mobility Specialist Time Calculation (min) (ACUTE ONLY) 14 min   Pt received in bed and agreeable to mobility. No complaints during session. Pt to bed after session with all needs met.    [facility]

## 2023-10-19 NOTE — Progress Notes (Signed)
 Medications delivered from [facility] outpatient pharmacy by this RN

## 2023-10-19 NOTE — Discharge Summary (Signed)
 Physician Discharge Summary  Susan Fletcher ZHY:[id number] DOB: 05/04/1946 DOA: 10/12/2023  PCP: Patient, No Pcp Per  Admit date: 10/12/2023 Discharge date: 10/19/2023  Admitted From: Home Disposition:  Home  Discharge Condition:Stable CODE STATUS:FULL Diet recommendation:Regular   Brief/Interim Summary: Patient is a 78 year old female with no significant past medical history who presented with complaint of abdominal pain, associated chills, diaphoresis.  On palpation she was hemodynamically stable.  Lab work showed elevated leukocytes, potassium of 3.1.  CT abdomen/pelvis showed severe sigmoid diverticulitis with multiple pericolonic abscess.  General surgery/IR  consulted .  IR placed a pelvic drain on 3/28, now removed on 4/2.  Currently tolerating diet, having bowel movements.  General surgery cleared for discharge.  She will follow-up with general surgery as an outpatient.  Continue oral antibiotics at home   Following problems were addressed during the hospitalization:  Sigmoid diverticulitis/pericolonic abscess: Presented with severe abdominal pain.  CT abdomen/pelvis as above.  Concern for partial colonic obstruction.  General surgery, IR consulted.  Started  on Unasyn.  IR placed a drain on 3/28 .cultures showed Klebsiella pneumonia.Ecoli, moderate bacteroides.  Will change  antibiotic to Augmentin on discharge   IR remove the drain on 4/2.  Leukocytosis improving.  She will follow-up with general surgery as an outpatient.  Continue Augmentin on discharge.  Abdomen is benign on examination.   GI bleed: Progressive anemia during this hospitalization, hemoglobin was down to 5.3, given 2 units of PRBC.  FOBT was positive.  CTA did not show any evidence of active GI bleed.  Currently hemoglobin stabilized.  EGD negative for acute source of bleeding.  No colonoscopy planned for now.  Probably will need in 6 weeks after inflammation is resolved.  Continue Protonix discharge    Hypokalemia/hypomagnesemia/hypophosphatemia: Supplemented   Hyponatremia:  Currently stable  Lower extremity edema: Likely because she received a lot of fluids during the hospitalization.  Patient declines to do an echocardiogram.  We discussed about continuing compression stockings, leg elevation while lying in bed.  I have advised to follow-up with her PCP in a week     Discharge Diagnoses:  Principal Problem:   Abscess of sigmoid colon due to diverticulitis Active Problems:   Hypokalemia   Hyponatremia   Normocytic anemia   Melena    Discharge Instructions  Discharge Instructions     Diet general   Complete by: As directed    Discharge instructions   Complete by: As directed    1)Please take your medications as instructed 2)Follow up with general surgery as an outpatient 3)Follow up with your PCP in a week.  Do a CBC, BMP test during the follow-up 4)Wear compression stockings on bilateral legs, elevate the legs while lying on bed 5)You might be called for  colonoscopy by gastroenterology.   Increase activity slowly   Complete by: As directed    No wound care   Complete by: As directed       Allergies as of 10/19/2023   No Known Allergies      Medication List     TAKE these medications    amoxicillin-clavulanate 875-125 MG tablet Commonly known as: AUGMENTIN Take 1 tablet by mouth every 12 (twelve) hours for 16 doses.   aspirin-acetaminophen-caffeine 250-250-65 MG tablet Commonly known as: EXCEDRIN MIGRAINE Take 1 tablet by mouth every 6 (six) hours as needed for headache.   ibuprofen 200 MG tablet Commonly known as: ADVIL Take 200 mg by mouth every 6 (six) hours as needed for mild pain (pain score  1-3).   pantoprazole 40 MG tablet Commonly known as: PROTONIX Take 1 tablet (40 mg total) by mouth daily for 14 days. Start taking on: October 20, 2023   potassium chloride SA 20 MEQ tablet Commonly known as: KLOR-CON M Take 2 tablets (40 mEq total) by  mouth daily.        Follow-up Information     [facility] Follow up.   Specialty: Radiology Why: IR schedulers will call you with a drain follow up appointment time approximatley 10-14 days after you are discharged. Please call [phone] with any scheduling questions or concerns Contact information: [address] [place] [address] [phone]        Harriette Bouillon, MD. Call.   Specialty: General Surgery Why: We are making a follow up appointment for you., Please call to confirm appointment time., Arrive 30 minutes early to complete check in, and bring photo ID and insurance card. Contact information: [address] [place] [address] [phone]                No Known Allergies  Consultations: Surgery,GI   Procedures/Studies: IR Sinus/Fist Tube Chk-Non GI Result Date: 10/18/2023 CLINICAL DATA:  Sigmoid diverticulitis with dominant pelvic abscess, status post right transgluteal drain catheter placement 10/13/2023. Drainage has tapered off. CT earlier today shows no significant residual undrained fluid. EXAM: ABSCESS DRAIN CATHETER INJECTION UNDER FLUOROSCOPY TECHNIQUE: The procedure, risks (including but not limited to bleeding, infection, organ damage), benefits, and alternatives were explained to the patient. Questions regarding the procedure were encouraged and answered. The patient understands and consents to the procedure. Survey fluoroscopic inspection reveals stable position of right transgluteal pigtail drain catheter. Injection demonstrates no fistula. Distension of a small residual abscess cavity, the contrast completely evacuated with aspiration. Some contrast leaks along the catheter shaft into the gluteal musculature. Given the lack of residual undrained fluid, and absence of demonstrated fistula to bowel, catheter removal is indicated. Catheter was cut and removed in its entirety without complication, and  a sterile dressing applied to the site. The patient tolerated the procedure well. IMPRESSION: 1. Resolution of pelvic abscess. No fistula to bowel. Drain catheter removed. Electronically Signed   By: Corlis Leak M.D.   On: 10/18/2023 19:09   CT ABDOMEN PELVIS W CONTRAST Result Date: 10/18/2023 CLINICAL DATA:  Status post drainage catheter placement for pelvic abscess. EXAM: CT ABDOMEN AND PELVIS WITH CONTRAST TECHNIQUE: Multidetector CT imaging of the abdomen and pelvis was performed using the standard protocol following bolus administration of intravenous contrast. RADIATION DOSE REDUCTION: This exam was performed according to the departmental dose-optimization program which includes automated exposure control, adjustment of the mA and/or kV according to patient size and/or use of iterative reconstruction technique. CONTRAST:  OMNIPAQUE IOHEXOL 300 MG/ML  SOLN COMPARISON:  October 15, 2023. FINDINGS: Lower chest: Small bilateral pleural effusions are noted with minimal adjacent subsegmental atelectasis. Hepatobiliary: Hepatic steatosis. Cholelithiasis. No significant biliary dilatation is noted. Pancreas: Unremarkable. No pancreatic ductal dilatation or surrounding inflammatory changes. Spleen: Normal in size without focal abnormality. Adrenals/Urinary Tract: Adrenal glands appear normal. No hydronephrosis or renal obstruction is noted. Urinary bladder is decompressed. Stomach/Bowel: Stomach is unremarkable. Continued wall thickening of sigmoid colon is noted consistent with diverticulitis. Right transgluteal pelvic drainage catheter is again noted. No significant residual fluid collection remains. Mild dilatation of more proximal colon is noted which may represent ileus. Vascular/Lymphatic: Aortic atherosclerosis. No enlarged abdominal or pelvic lymph nodes. Reproductive:  Intrauterine device is again noted. No definite adnexal abnormality is noted. Other: No hernia is noted.  Minimal ascites is noted.  Musculoskeletal: No acute or significant osseous findings. IMPRESSION: Stable position of right transgluteal pelvic drainage catheter. No significant residual fluid collection remains. Continued wall thickening of sigmoid colon is noted consistent with diverticulitis. Mild dilatation of more proximal colon is noted which may represent ileus. Minimal ascites. Hepatic steatosis and cholelithiasis. Small bilateral pleural effusions are noted with minimal adjacent subsegmental atelectasis. Aortic Atherosclerosis (ICD10-I70.0). Electronically Signed   By: Lupita Raider M.D.   On: 10/18/2023 13:21   CT ANGIO GI BLEED Result Date: 10/15/2023 CLINICAL DATA:  Sigmoid diverticulitis with perirectal abscess status post percutaneous drain, concern for gastrointestinal bleeding EXAM: CTA ABDOMEN AND PELVIS WITHOUT AND WITH CONTRAST TECHNIQUE: Multidetector CT imaging of the abdomen and pelvis was performed using the standard protocol during bolus administration of intravenous contrast. Multiplanar reconstructed images and MIPs were obtained and reviewed to evaluate the vascular anatomy. RADIATION DOSE REDUCTION: This exam was performed according to the departmental dose-optimization program which includes automated exposure control, adjustment of the mA and/or kV according to patient size and/or use of iterative reconstruction technique. CONTRAST:  80mL OMNIPAQUE IOHEXOL 350 MG/ML SOLN COMPARISON:  10/13/2023, 10/12/2023 FINDINGS: VASCULAR Aorta: Normal caliber aorta without aneurysm, dissection, vasculitis or significant stenosis. Diffuse atherosclerosis. Celiac: Patent without evidence of aneurysm, dissection, vasculitis or significant stenosis. SMA: Patent without evidence of aneurysm, dissection, vasculitis or significant stenosis. Renals: Both renal arteries are patent without evidence of aneurysm, dissection, vasculitis, fibromuscular dysplasia or significant stenosis. IMA: Patent without evidence of aneurysm,  dissection, vasculitis or significant stenosis. Inflow: Patent without evidence of aneurysm, dissection, vasculitis or significant stenosis. Proximal Outflow: Bilateral common femoral and visualized portions of the superficial and profunda femoral arteries are patent without evidence of aneurysm, dissection, vasculitis or significant stenosis. Veins: No obvious venous abnormality within the limitations of this arterial phase study. Review of the MIP images confirms the above findings. NON-VASCULAR Lower chest: Small bilateral pleural effusions with dependent lower lobe atelectasis. Hepatobiliary: Multiple punctate calcified gallstones without evidence of acute cholecystitis. No biliary duct dilation or evidence of choledocholithiasis. The liver is unremarkable. Pancreas: Unremarkable. No pancreatic ductal dilatation or surrounding inflammatory changes. Spleen: Normal in size without focal abnormality. Adrenals/Urinary Tract: Kidneys enhance normally and symmetrically. No urinary tract calculi or obstruction. Bladder is unremarkable. The adrenals are stable. Stomach/Bowel: Wall thickening throughout the sigmoid colon again identified consistent with known sigmoid diverticulitis. Interval placement of a percutaneous pigtail drainage catheter anterior to the sacrum, decompressing the presacral fluid collection. There are several other smaller fluid collections within the left mid abdomen, largest measuring 2.1 cm in size, decreased since the previous evaluation suggesting communication with the presacral abscess evacuated by the pigtail drainage catheter. Continued follow-up is recommended. No evidence of high-grade bowel obstruction. Scattered gas fluid levels throughout nondilated loops of small bowel may suggest ileus. High attenuation retained oral contrast within the colon limits the evaluation for active gastrointestinal bleeding. There is no evidence of intraluminal contrast accumulation to suggest active  gastrointestinal bleeding. There is marked mucosal hyperenhancement within the region of sigmoid diverticulitis described above. Lymphatic: No pathologic adenopathy. Reproductive: Uterus is atrophic. IUD again identified unchanged. No adnexal masses. Other: Trace pelvic free fluid. No free intraperitoneal gas. No abdominal wall hernia. Musculoskeletal: No acute or destructive bony abnormalities. Reconstructed images demonstrate no additional findings. IMPRESSION: VASCULAR 1. No evidence of active gastrointestinal bleeding. High attenuation stool/retained contrast within the  distal colon does limit evaluation. 2.  Aortic Atherosclerosis (ICD10-I70.0). NON-VASCULAR 1. Sigmoid diverticulitis, with interval decrease in the diverticular abscesses seen on prior study after interval percutaneous pigtail drainage catheter placement. 2. Cholelithiasis without cholecystitis. 3. Small bilateral pleural effusions with dependent lower lobe atelectasis. 4. Scattered gas fluid levels throughout nondilated loops of small bowel, compatible with ileus. No evidence of high-grade obstruction. 5. Trace pelvic free fluid. Electronically Signed   By: Sharlet Salina M.D.   On: 10/15/2023 19:00   CT GUIDED PERITONEAL/RETROPERITONEAL FLUID DRAIN BY PERC CATH Result Date: 10/13/2023 INDICATION: 78 year old female with perforated diverticulitis and perirectal abscess. EXAM: CT-guided drain placement TECHNIQUE: Multidetector CT imaging of the low abdomen/pelvis was performed following the standard protocol without IV contrast. RADIATION DOSE REDUCTION: This exam was performed according to the departmental dose-optimization program which includes automated exposure control, adjustment of the mA and/or kV according to patient size and/or use of iterative reconstruction technique. MEDICATIONS: The patient is currently admitted to the hospital and receiving intravenous antibiotics. The antibiotics were administered within an appropriate time  frame prior to the initiation of the procedure. ANESTHESIA/SEDATION: Moderate (conscious) sedation was employed during this procedure. A total of Versed 2 mg and Fentanyl 50 mcg was administered intravenously by the radiology nurse. Total intra-service moderate Sedation Time: 15 minutes. The patient's level of consciousness and vital signs were monitored continuously by radiology nursing throughout the procedure under my direct supervision. COMPLICATIONS: None immediate. PROCEDURE: Informed written consent was obtained from the patient after a thorough discussion of the procedural risks, benefits and alternatives. All questions were addressed. Maximal Sterile Barrier Technique was utilized including caps, mask, sterile gowns, sterile gloves, sterile drape, hand hygiene and skin antiseptic. A timeout was performed prior to the initiation of the procedure. Axial CT imaging was performed. The small abscess collection anterior to the sacrum and just superior to the rectum was successfully localized. A suitable skin entry site was selected and marked. Local anesthesia was attained by infiltration with 1% lidocaine. A small dermatotomy was made. Utilizing intermittent CT guidance, an 18 gauge trocar needle was carefully advanced along a right parasacral approach into the fluid collection. A 0.035 wire was then coiled in the fluid collection. Percutaneous tract was dilated to 10 [place]. A skater 10 [place] all-purpose drainage catheter was advanced over the wire and formed. Aspiration yields 7 mL of frankly purulent fluid. Samples were sent for Gram stain and culture. The drainage catheter was gently flushed, connected to JP bulb suction and then secured to the skin with 0 Prolene suture. Follow-up CT imaging demonstrates a well-positioned drainage catheter with no evidence of complication. IMPRESSION: Successful placement of 10 French drainage catheter via a right transgluteal approach into the perirectal abscess.  Electronically Signed   By: Malachy Moan M.D.   On: 10/13/2023 14:30   CT ABDOMEN PELVIS W CONTRAST Result Date: 10/12/2023 CLINICAL DATA:  Acute generalized abdominal pain. EXAM: CT ABDOMEN AND PELVIS WITH CONTRAST TECHNIQUE: Multidetector CT imaging of the abdomen and pelvis was performed using the standard protocol following bolus administration of intravenous contrast. RADIATION DOSE REDUCTION: This exam was performed according to the departmental dose-optimization program which includes automated exposure control, adjustment of the mA and/or kV according to patient size and/or use of iterative reconstruction technique. CONTRAST:  80mL OMNIPAQUE IOHEXOL 300 MG/ML  SOLN COMPARISON:  None Available. FINDINGS: Lower chest: No acute abnormality. Hepatobiliary: Hepatic steatosis is noted. Mild cholelithiasis is noted. No biliary dilatation is noted. Pancreas: Unremarkable. No pancreatic ductal dilatation  or surrounding inflammatory changes. Spleen: Normal in size without focal abnormality. Adrenals/Urinary Tract: Adrenal glands are unremarkable. Kidneys are normal, without renal calculi, focal lesion, or hydronephrosis. Bladder is unremarkable. Stomach/Bowel: The stomach is unremarkable. There is no evidence of small bowel dilatation. The appendix is not clearly visualized. There appears to be severe sigmoid diverticulitis present with multiple small pericolonic abscesses. The largest measures 6.3 x 2.8 cm best seen on image number 56 of series 8. Probable 3.6 x 3.1 cm abscess is noted posteriorly in the pelvis. There appears to be mild dilatation of the more proximal colon suggesting partial obstruction secondary to this inflammation. Vascular/Lymphatic: Aortic atherosclerosis. No enlarged abdominal or pelvic lymph nodes. Reproductive: Intrauterine device is noted. Adnexal regions are not well visualized due to previously described inflammation in the pelvis. Other: No definite hernia is noted.  Musculoskeletal: No acute or significant osseous findings. IMPRESSION: Severe sigmoid diverticulitis is noted with multiple pericolonic abscess is present. The largest measures 6.3 x 2.8 cm anteriorly in the pelvis. Another measures 3.6 x 3.1 cm posteriorly. Mild dilatation of more proximal colon is noted suggesting partial obstruction secondary to this inflammation. Hepatic steatosis. Mild cholelithiasis. Aortic Atherosclerosis (ICD10-I70.0). Electronically Signed   By: Lupita Raider M.D.   On: 10/12/2023 17:51      Subjective: Patient seen and examined at the bedside today.  Hemodynamically stable.  She still has some diarrhea today, 1 episode.  Denies any abdominal pain, nausea or vomiting.  Has lower extremity edema.  Patient does not want to stay anymore, desperately wants to go home.  Discharge Exam: Vitals:   10/18/23 2015 10/19/23 0534  BP: 110/68 124/74  Pulse: 95 82  Resp: 18 20  Temp: 97.8 F (36.6 C) 98 F (36.7 C)  SpO2: 97% 98%   Vitals:   10/18/23 0610 10/18/23 1407 10/18/23 2015 10/19/23 0534  BP: 111/71 111/71 110/68 124/74  Pulse: 82 90 95 82  Resp: 16 20 18 20   Temp: 98.2 F (36.8 C) 98 F (36.7 C) 97.8 F (36.6 C) 98 F (36.7 C)  TempSrc: Oral Oral Oral Oral  SpO2: 97% 99% 97% 98%  Weight:      Height:        General: Pt is alert, awake, not in acute distress Cardiovascular: RRR, S1/S2 +, no rubs, no gallops Respiratory: CTA bilaterally, no wheezing, no rhonchi Abdominal: Soft, NT, ND, bowel sounds + Extremities: lower extremity edema, no cyanosis    The results of significant diagnostics from this hospitalization (including imaging, microbiology, ancillary and laboratory) are listed below for reference.     Microbiology: Recent Results (from the past 240 hours)  Blood culture (routine x 2)     Status: None   Collection Time: 10/12/23  4:40 PM   Specimen: BLOOD  Result Value Ref Range Status   Specimen Description   Final    BLOOD SITE NOT  SPECIFIED Performed at [facility], [address] [place], [place] [address]    Special Requests   Final    BOTTLES DRAWN AEROBIC AND ANAEROBIC Blood Culture results may not be optimal due to an inadequate volume of blood received in culture bottles Performed at [facility], [address] [place], [place] [address]    Culture   Final    NO GROWTH 5 DAYS Performed at [facility], [address], [place], [place] [address]    Report Status 10/17/2023 FINAL  Final  Blood culture (routine x 2)  Status: None   Collection Time: 10/12/23  4:43 PM   Specimen: BLOOD  Result Value Ref Range Status   Specimen Description   Final    BLOOD SITE NOT SPECIFIED Performed at [facility], [address], [place], [place] [address]    Special Requests   Final    BOTTLES DRAWN AEROBIC AND ANAEROBIC Blood Culture results may not be optimal due to an inadequate volume of blood received in culture bottles Performed at [facility], [address] [place], [place] [address]    Culture   Final    NO GROWTH 5 DAYS Performed at [facility], [address], [place], [place] [address]    Report Status 10/17/2023 FINAL  Final  Aerobic/Anaerobic Culture w Gram Stain (surgical/deep wound)     Status: None   Collection Time: 10/13/23  2:31 PM   Specimen: Abscess  Result Value Ref Range Status   Specimen Description   Final    ABSCESS Performed at [facility], [address] [place], [place] [address]    Special Requests   Final    NONE Performed at [facility], [address], [place], [place] [address]    Gram Stain   Final    ABUNDANT WBC PRESENT, PREDOMINANTLY PMN FEW GRAM NEGATIVE RODS    Culture   Final    RARE KLEBSIELLA PNEUMONIAE RARE ESCHERICHIA COLI MODERATE BACTEROIDES THETAIOTAOMICRON BETA LACTAMASE NEGATIVE Performed at [facility],  [address], [place], [place] [address]    Report Status 10/17/2023 FINAL  Final   Organism ID, Bacteria KLEBSIELLA PNEUMONIAE  Final   Organism ID, Bacteria ESCHERICHIA COLI  Final      Susceptibility   Escherichia coli - MIC*    AMPICILLIN 4 SENSITIVE Sensitive     CEFEPIME <=0.12 SENSITIVE Sensitive     CEFTAZIDIME <=1 SENSITIVE Sensitive     CEFTRIAXONE <=0.25 SENSITIVE Sensitive     CIPROFLOXACIN <=0.25 SENSITIVE Sensitive     GENTAMICIN <=1 SENSITIVE Sensitive     IMIPENEM <=0.25 SENSITIVE Sensitive     TRIMETH/SULFA <=20 SENSITIVE Sensitive     AMPICILLIN/SULBACTAM <=2 SENSITIVE Sensitive     PIP/TAZO <=4 SENSITIVE Sensitive ug/mL    * RARE ESCHERICHIA COLI   Klebsiella pneumoniae - MIC*    AMPICILLIN >=32 RESISTANT Resistant     CEFEPIME <=0.12 SENSITIVE Sensitive     CEFTAZIDIME <=1 SENSITIVE Sensitive     CEFTRIAXONE <=0.25 SENSITIVE Sensitive     CIPROFLOXACIN <=0.25 SENSITIVE Sensitive     GENTAMICIN <=1 SENSITIVE Sensitive     IMIPENEM <=0.25 SENSITIVE Sensitive     TRIMETH/SULFA <=20 SENSITIVE Sensitive     AMPICILLIN/SULBACTAM 4 SENSITIVE Sensitive     PIP/TAZO <=4 SENSITIVE Sensitive ug/mL    * RARE KLEBSIELLA PNEUMONIAE     Labs: BNP (last 3 results) No results for input(s): "BNP" in the last 8760 hours. Basic Metabolic Panel: Recent Labs  Lab 10/14/23 0511 10/15/23 0439 10/16/23 0117 10/17/23 0516 10/18/23 0504 10/19/23 0459  NA 132* 134* 133* 133* 134* 133*  K 3.4* 3.8 3.2* 3.0* 3.6 3.2*  CL 98 101 102 101 102 99  CO2 20* 26 22 22 24 25   GLUCOSE 99 114* 112* 97 101* 104*  BUN 12 36* 21 14 11 11   CREATININE 0.50 0.47 0.38* <0.30* 0.32* 0.40*  CALCIUM 7.8* 7.5* 7.1* 7.6* 7.5* 7.7*  MG 1.7 1.9  --  1.8 2.2  --  PHOS 2.2* 1.5*  --  2.5 2.3* 3.5   Liver Function Tests: Recent Labs  Lab 10/12/23 1351  AST 16  ALT 13  ALKPHOS 74  BILITOT 0.7  PROT 5.9*  ALBUMIN 2.3*   Recent Labs  Lab 10/12/23 1351  LIPASE 25   No results for input(s):  "AMMONIA" in the last 168 hours. CBC: Recent Labs  Lab 10/15/23 0439 10/15/23 1159 10/16/23 0117 10/16/23 0711 10/16/23 1552 10/16/23 1848 10/17/23 0516 10/18/23 0504 10/19/23 0459  WBC 17.3*  --  20.6*  --   --   --  19.4* 17.8* 15.4*  HGB 6.5*   < > 8.8*   < > 8.9* 9.3* 9.3* 8.7* 9.6*  HCT 19.6*   < > 25.3*   < > 26.1* 27.4* 27.4* 25.7* 29.4*  MCV 92.9  --  87.8  --   --   --  91.0 90.8 93.6  PLT 448*  --  343  --   --   --  445* 501* 588*   < > = values in this interval not displayed.   Cardiac Enzymes: No results for input(s): "CKTOTAL", "CKMB", "CKMBINDEX", "TROPONINI" in the last 168 hours. BNP: Invalid input(s): "POCBNP" CBG: No results for input(s): "GLUCAP" in the last 168 hours. D-Dimer No results for input(s): "DDIMER" in the last 72 hours. Hgb A1c No results for input(s): "HGBA1C" in the last 72 hours. Lipid Profile No results for input(s): "CHOL", "HDL", "LDLCALC", "TRIG", "CHOLHDL", "LDLDIRECT" in the last 72 hours. Thyroid function studies No results for input(s): "TSH", "T4TOTAL", "T3FREE", "THYROIDAB" in the last 72 hours.  Invalid input(s): "FREET3" Anemia work up No results for input(s): "VITAMINB12", "FOLATE", "FERRITIN", "TIBC", "IRON", "RETICCTPCT" in the last 72 hours. Urinalysis    Component Value Date/Time   COLORURINE YELLOW 10/12/2023 1527   APPEARANCEUR CLEAR 10/12/2023 1527   LABSPEC 1.010 10/12/2023 1527   PHURINE 6.0 10/12/2023 1527   GLUCOSEU NEGATIVE 10/12/2023 1527   HGBUR MODERATE (A) 10/12/2023 1527   BILIRUBINUR NEGATIVE 10/12/2023 1527   KETONESUR 80 (A) 10/12/2023 1527   PROTEINUR NEGATIVE 10/12/2023 1527   NITRITE NEGATIVE 10/12/2023 1527   LEUKOCYTESUR SMALL (A) 10/12/2023 1527   Sepsis Labs Recent Labs  Lab 10/16/23 0117 10/17/23 0516 10/18/23 0504 10/19/23 0459  WBC 20.6* 19.4* 17.8* 15.4*   Microbiology Recent Results (from the past 240 hours)  Blood culture (routine x 2)     Status: None   Collection Time:  10/12/23  4:40 PM   Specimen: BLOOD  Result Value Ref Range Status   Specimen Description   Final    BLOOD SITE NOT SPECIFIED Performed at [facility], [address], [place], [place] [address]    Special Requests   Final    BOTTLES DRAWN AEROBIC AND ANAEROBIC Blood Culture results may not be optimal due to an inadequate volume of blood received in culture bottles Performed at [facility], [address], [place], [place] [address]    Culture   Final    NO GROWTH 5 DAYS Performed at [facility], [address] [place], [place] [address]    Report Status 10/17/2023 FINAL  Final  Blood culture (routine x 2)     Status: None   Collection Time: 10/12/23  4:43 PM   Specimen: BLOOD  Result Value Ref Range Status   Specimen Description   Final    BLOOD SITE NOT SPECIFIED Performed at [facility], [address]  [place], [place] [address]    Special Requests   Final    BOTTLES DRAWN AEROBIC AND ANAEROBIC Blood Culture results may not be optimal due to an inadequate volume of blood received in culture bottles Performed at [facility], [address], [place], [place] [address]    Culture   Final    NO GROWTH 5 DAYS Performed at [facility], [address], [place], [place] [address]    Report Status 10/17/2023 FINAL  Final  Aerobic/Anaerobic Culture w Gram Stain (surgical/deep wound)     Status: None   Collection Time: 10/13/23  2:31 PM   Specimen: Abscess  Result Value Ref Range Status   Specimen Description   Final    ABSCESS Performed at [facility] [address], [place], [place] [address]    Special Requests   Final    NONE Performed at [facility], [address] [place], [place] [address]    Gram Stain   Final    ABUNDANT WBC PRESENT, PREDOMINANTLY PMN FEW GRAM NEGATIVE RODS    Culture   Final    RARE KLEBSIELLA PNEUMONIAE RARE  ESCHERICHIA COLI MODERATE BACTEROIDES THETAIOTAOMICRON BETA LACTAMASE NEGATIVE Performed at [facility], [address] [place], [place] [address]    Report Status 10/17/2023 FINAL  Final   Organism ID, Bacteria KLEBSIELLA PNEUMONIAE  Final   Organism ID, Bacteria ESCHERICHIA COLI  Final      Susceptibility   Escherichia coli - MIC*    AMPICILLIN 4 SENSITIVE Sensitive     CEFEPIME <=0.12 SENSITIVE Sensitive     CEFTAZIDIME <=1 SENSITIVE Sensitive     CEFTRIAXONE <=0.25 SENSITIVE Sensitive     CIPROFLOXACIN <=0.25 SENSITIVE Sensitive     GENTAMICIN <=1 SENSITIVE Sensitive     IMIPENEM <=0.25 SENSITIVE Sensitive     TRIMETH/SULFA <=20 SENSITIVE Sensitive     AMPICILLIN/SULBACTAM <=2 SENSITIVE Sensitive     PIP/TAZO <=4 SENSITIVE Sensitive ug/mL    * RARE ESCHERICHIA COLI   Klebsiella pneumoniae - MIC*    AMPICILLIN >=32 RESISTANT Resistant     CEFEPIME <=0.12 SENSITIVE Sensitive     CEFTAZIDIME <=1 SENSITIVE Sensitive     CEFTRIAXONE <=0.25 SENSITIVE Sensitive     CIPROFLOXACIN <=0.25 SENSITIVE Sensitive     GENTAMICIN <=1 SENSITIVE Sensitive     IMIPENEM <=0.25 SENSITIVE Sensitive     TRIMETH/SULFA <=20 SENSITIVE Sensitive     AMPICILLIN/SULBACTAM 4 SENSITIVE Sensitive     PIP/TAZO <=4 SENSITIVE Sensitive ug/mL    * RARE KLEBSIELLA PNEUMONIAE    Please note: You were cared for by a hospitalist during your hospital stay. Once you are discharged, your primary care physician will handle any further medical issues. Please note that NO REFILLS for any discharge medications will be authorized once you are discharged, as it is imperative that you return to your primary care physician (or establish a relationship with a primary care physician if you do not have one) for your post hospital discharge needs so that they can reassess your need for medications and monitor your lab values.    Time coordinating discharge: 40 minutes  SIGNED:   Burnadette Pop, MD  [facility] 10/19/2023, 10:51 AM Pager [phone]  If 7PM-7AM, please contact night-coverage [email or web address] Password [email or web address]

## 2023-10-20 ENCOUNTER — Other Ambulatory Visit: Payer: Self-pay | Admitting: Surgery

## 2023-10-20 DIAGNOSIS — K5720 Diverticulitis of large intestine with perforation and abscess without bleeding: Secondary | ICD-10-CM

## 2023-10-24 DIAGNOSIS — K5730 Diverticulosis of large intestine without perforation or abscess without bleeding: Secondary | ICD-10-CM | POA: Diagnosis not present

## 2023-10-24 DIAGNOSIS — D509 Iron deficiency anemia, unspecified: Secondary | ICD-10-CM | POA: Diagnosis not present

## 2023-10-24 DIAGNOSIS — R933 Abnormal findings on diagnostic imaging of other parts of digestive tract: Secondary | ICD-10-CM | POA: Diagnosis not present

## 2023-10-24 DIAGNOSIS — Z1211 Encounter for screening for malignant neoplasm of colon: Secondary | ICD-10-CM | POA: Diagnosis not present

## 2023-10-26 DIAGNOSIS — K5730 Diverticulosis of large intestine without perforation or abscess without bleeding: Secondary | ICD-10-CM | POA: Diagnosis not present

## 2023-10-26 DIAGNOSIS — K760 Fatty (change of) liver, not elsewhere classified: Secondary | ICD-10-CM | POA: Diagnosis not present

## 2023-10-26 DIAGNOSIS — D509 Iron deficiency anemia, unspecified: Secondary | ICD-10-CM | POA: Diagnosis not present

## 2023-10-26 DIAGNOSIS — K8020 Calculus of gallbladder without cholecystitis without obstruction: Secondary | ICD-10-CM | POA: Diagnosis not present

## 2023-10-26 DIAGNOSIS — R933 Abnormal findings on diagnostic imaging of other parts of digestive tract: Secondary | ICD-10-CM | POA: Diagnosis not present

## 2023-10-26 DIAGNOSIS — Z1211 Encounter for screening for malignant neoplasm of colon: Secondary | ICD-10-CM | POA: Diagnosis not present

## 2023-10-26 DIAGNOSIS — K2970 Gastritis, unspecified, without bleeding: Secondary | ICD-10-CM | POA: Diagnosis not present

## 2023-11-01 ENCOUNTER — Other Ambulatory Visit

## 2023-11-08 ENCOUNTER — Other Ambulatory Visit

## 2023-12-05 ENCOUNTER — Other Ambulatory Visit (HOSPITAL_COMMUNITY): Payer: Self-pay
